# Patient Record
Sex: Male | Born: 1970 | Race: White | Hispanic: No | Marital: Single | Smoking: Former smoker
Health system: Southern US, Community
[De-identification: ages and names within clinical notes are randomized; demographics above are authoritative.]

## PROBLEM LIST (undated history)

## (undated) DIAGNOSIS — T8859XA Other complications of anesthesia, initial encounter: Secondary | ICD-10-CM

## (undated) DIAGNOSIS — K219 Gastro-esophageal reflux disease without esophagitis: Secondary | ICD-10-CM

## (undated) DIAGNOSIS — T4145XA Adverse effect of unspecified anesthetic, initial encounter: Secondary | ICD-10-CM

## (undated) DIAGNOSIS — F419 Anxiety disorder, unspecified: Secondary | ICD-10-CM

## (undated) HISTORY — PX: SHOULDER SURGERY: SHX246

---

## 2001-12-01 ENCOUNTER — Ambulatory Visit (HOSPITAL_BASED_OUTPATIENT_CLINIC_OR_DEPARTMENT_OTHER): Admission: RE | Admit: 2001-12-01 | Discharge: 2001-12-01 | Payer: Self-pay | Admitting: Orthopedic Surgery

## 2002-08-10 ENCOUNTER — Encounter: Admission: RE | Admit: 2002-08-10 | Discharge: 2002-10-12 | Payer: Self-pay | Admitting: Internal Medicine

## 2003-05-31 ENCOUNTER — Emergency Department (HOSPITAL_COMMUNITY): Admission: EM | Admit: 2003-05-31 | Discharge: 2003-05-31 | Payer: Self-pay | Admitting: Family Medicine

## 2003-06-04 ENCOUNTER — Emergency Department (HOSPITAL_COMMUNITY): Admission: EM | Admit: 2003-06-04 | Discharge: 2003-06-04 | Payer: Self-pay | Admitting: Family Medicine

## 2004-02-10 HISTORY — PX: KNEE SURGERY: SHX244

## 2005-09-15 ENCOUNTER — Ambulatory Visit: Payer: Self-pay | Admitting: Internal Medicine

## 2005-09-20 ENCOUNTER — Emergency Department (HOSPITAL_COMMUNITY): Admission: EM | Admit: 2005-09-20 | Discharge: 2005-09-20 | Payer: Self-pay | Admitting: Emergency Medicine

## 2005-09-30 ENCOUNTER — Ambulatory Visit: Payer: Self-pay

## 2005-10-19 ENCOUNTER — Encounter: Admission: RE | Admit: 2005-10-19 | Discharge: 2005-10-19 | Payer: Self-pay | Admitting: Sports Medicine

## 2005-11-12 ENCOUNTER — Encounter: Admission: RE | Admit: 2005-11-12 | Discharge: 2005-11-12 | Payer: Self-pay | Admitting: Sports Medicine

## 2006-01-11 ENCOUNTER — Ambulatory Visit (HOSPITAL_COMMUNITY): Admission: RE | Admit: 2006-01-11 | Discharge: 2006-01-11 | Payer: Self-pay | Admitting: Orthopedic Surgery

## 2006-02-25 ENCOUNTER — Ambulatory Visit (HOSPITAL_COMMUNITY): Admission: RE | Admit: 2006-02-25 | Discharge: 2006-02-25 | Payer: Self-pay | Admitting: Orthopedic Surgery

## 2007-01-12 ENCOUNTER — Encounter: Payer: Self-pay | Admitting: Internal Medicine

## 2007-01-12 DIAGNOSIS — K219 Gastro-esophageal reflux disease without esophagitis: Secondary | ICD-10-CM

## 2007-01-12 DIAGNOSIS — F3289 Other specified depressive episodes: Secondary | ICD-10-CM | POA: Insufficient documentation

## 2007-01-12 DIAGNOSIS — F411 Generalized anxiety disorder: Secondary | ICD-10-CM | POA: Insufficient documentation

## 2007-01-12 DIAGNOSIS — M25519 Pain in unspecified shoulder: Secondary | ICD-10-CM | POA: Insufficient documentation

## 2007-01-12 DIAGNOSIS — F329 Major depressive disorder, single episode, unspecified: Secondary | ICD-10-CM

## 2007-08-14 ENCOUNTER — Emergency Department (HOSPITAL_COMMUNITY): Admission: EM | Admit: 2007-08-14 | Discharge: 2007-08-14 | Payer: Self-pay | Admitting: Family Medicine

## 2007-09-13 ENCOUNTER — Encounter: Admission: RE | Admit: 2007-09-13 | Discharge: 2007-09-13 | Payer: Self-pay | Admitting: Family Medicine

## 2007-09-16 ENCOUNTER — Encounter: Admission: RE | Admit: 2007-09-16 | Discharge: 2007-09-16 | Payer: Self-pay | Admitting: Family Medicine

## 2010-03-01 ENCOUNTER — Encounter: Payer: Self-pay | Admitting: Sports Medicine

## 2010-03-02 ENCOUNTER — Encounter: Payer: Self-pay | Admitting: Internal Medicine

## 2010-06-27 NOTE — Op Note (Signed)
NAME:  Scott Horton, Scott Horton                         ACCOUNT NO.:  [id number]   MEDICAL RECORD NO.:  [id number]                   PATIENT TYPE:  AMB   LOCATION:  DSC                                  FACILITY:  [facility]   PHYSICIAN:  Loreta Ave, M.D.              DATE OF BIRTH:  April 21, 1970   DATE OF PROCEDURE:  12/01/2001  DATE OF DISCHARGE:                                 OPERATIVE REPORT   PREOPERATIVE DIAGNOSES:  1. Impingement, right shoulder, with question tearing, infraspinatus tendon.  2. Grade 4 degenerative joint disease, acromioclavicular joint.   POSTOPERATIVE DIAGNOSES:  1. Right shoulder chronic impingement.  2. No full-thickness tears of the cuff.  Partial-thickness tearing of the     subscapularis tendon.  Labrum tear.  3. Grade 4 degenerative joint disease, acromioclavicular joint.  4. Thickened subacromial bursa.   PROCEDURES:  1. Right shoulder examination under anesthesia, arthroscopy, debridement of     labrum and partial tearing of subscapularis tendon.  2. Arthroscopic acromioplasty and excision, distal clavicle.   SURGEON:  Loreta Ave, M.D.   ASSISTANT:  Arlys John D. Petrarca, P.A.-C.   ANESTHESIA:  General.   ESTIMATED BLOOD LOSS:  Minimal.   SPECIMENS:  None.   CULTURES:  None.   COMPLICATIONS:  None.   DRESSING:  Soft compressive with sling.   DESCRIPTION OF PROCEDURE:  Patient brought to the operating room and after  adequate anesthesia had been obtained, the right shoulder examined.  Full  motion and good stability.  Placed in the beach chair position on the  shoulder positioner, prepped and draped in the usual sterile fashion.  Three  standard portals, anterior, posterior, lateral.  Shoulder entered with a  blunt obturator, distended, and inspected.  Complex tearing, labrum anterior  and posterior, debrided with a shaver.  Superficial partial-thickness  tearing of subscapularis tendon, debrided with a shaver, leaving good  integrity of  the tendon after debridement.  Biceps tendon, biceps anchor,  undersurface rotator cuff, infraspinatus and supraspinatus intact.  Cannula  redirected subacromially.  Typical impingement.  Bursa resected.  Type 2  acromion.  Converted to a type 1 acromion with shaver and high-speed bur,  releasing CA ligamnet.  Top of the cuff had some abrasive changes, which  were debrided.  The infraspinatus and supraspinatus thoroughly inspected and  no other structural tears.  Grade 4 tears of the [facility] joint with spurring.  Lateral centimeter of clavicle resected along with periarticular spurs.  After the procedure, decompression confirmed viewing from all portals.  The  instruments and fluid removed.  Portals, shoulder, and bursa injected with  Marcaine.  Portals closed with 4-0 nylon.  Sterile compressive dressing  applied.  Anesthesia reversed.  Brought to the recovery room.  Tolerated the  surgery well with no complications.  Loreta Ave, M.D.    DFM/MEDQ  D:  12/01/2001  T:  12/01/2001  Job:  [id number]

## 2010-06-27 NOTE — Assessment & Plan Note (Signed)
[facility]                             PRIMARY CARE OFFICE NOTE   NAME:Scott Horton, Scott Horton                      MRN:          [id number]  DATE:09/15/2005                            DOB:          09/21/1970    CHIEF COMPLAINT:  New patient to practice/chest pain.   HISTORY OF PRESENT ILLNESS:  The patient is a 40 year old white male who  comes to see me to establish primary care.  He was previously followed by  Dr. Kevan Ny in [place].  The patient has been fairly healthy for most of  his life.  He denies any major illnesses or hospitalizations.  He has a  history of arthroscopic surgery within the past 6 months to a year by  orthopedic group Eulah Pont and Thurston Hole.  States that he does have residual right  shoulder discomfort.  His only other history is some issues with  mood/anxiety.  He was placed on SSRI Paxil.  This was ultimately switched to  Lexapro.  This was also the time around his brother's death.  He had some  grief reaction and it has been approximately 1 year since he has been on  Lexapro.  He has done fairly well, does not report any side effects and his  co-workers and family have noted a significant improvement of mood and  disposition while he is on Lexapro.  Denies any other history mood disorder.  He does have chronic insomnia.  He denies chronic irritability.  Lexapro or  other SSRI have not caused any mania type symptoms.  He is divorced but has  had no issues with keeping a job or holding a job.   He mentions during our interview that he has experienced intermittent chest  pain over the last several months.  He states it is nonexertional.  The last  time he experienced chest pain it was at work.  He was not lifting anything  heavy but did notice a tightness on the left side of his chest that lasted  approximately 2 hours and resolved on its own.  There was no associated  shortness of breath or diaphoresis and the pain has not recurred  since that  time.   He does, however, have a significant family history.  His father died at age  67 secondary to coronary artery disease/MI.  His father is noted to be a  long time smoker and was also an alcoholic and a mild diabetic.   The patient was a smoker up until recently.  He quit 1 month ago but smoked  1 pack per day times 15-16 years.   PAST MEDICAL HISTORY SUMMARY:  1.  Anxiety/depression.  2.  History of right shoulder arthroscopy.  3.  History of tobacco abuse.   CURRENT MEDICATIONS:  Lexapro 10 mg once a day.   ALLERGIES TO MEDICATIONS:  NONE KNOWN.   SOCIAL HISTORY:  The patient is divorced.  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   FAMILY HISTORY:  As noted above.  Father deceased at age 49 secondary to  coronary artery disease.  Mother alive at age 73, has hypertension, treated.  He has 2 older sister that are healthy.  He had a younger brother deceased  secondary to motor vehicle accident.   HABITS:  He drinks 2-3 beers per week.  Tobacco:  History as noted above.   REVIEW OF SYSTEMS:  No fevers, chills.  No HEENT symptoms.  CARDIOVASCULAR:  Symptoms as noted above.  He denies any exercise intolerance.  He has  frequent heartburn with nocturnal symptoms.  Denies any dysphagia.  No dark  stools or blood in his stool.  No dysuria, frequency, urgency and all other  systems negative.   PHYSICAL EXAMINATION:  VITAL SIGNS:  Height is 5 foot 9 inches, weight is  200 pounds, temperature is 97.6, pulse is 80, BP is 138/82 in the left arm  in a seated position with automated cuff, 130/80 with manual cuff.  GENERAL:  The patient is a pleasant, well-developed, well-nourished, 35-year-  old, white male in no apparent distress.  HEENT:  Normocephalic atraumatic.  Pupils are equal and reactive to light  bilaterally.  Extraocular motility was intact.  The patient was anicteric.   Conjunctivae was within normal limits.  There were some small scars near the  bridge of his nose, near the right corner of his eye.  External auditory  canals, tympanic membranes were clear bilaterally.  Hearing was grossly  normal.  Oropharyngeal exam was unremarkable.  NECK:  Supple.  No adenopathy, carotid bruits, or thyromegaly.  CHEST:  Normal respiratory effort.  Clear to auscultation bilaterally.  No  rhonchi, rales, or wheezing.  CARDIOVASCULAR:  Regular rate and rhythm.  No significant murmurs, rubs, or  gallops appreciated.  There was no palpable tenderness near his  costochondral junction.  ABDOMEN:  Soft, minimal epigastric tenderness.  No organomegaly appreciated.  EXTREMITIES:  No clubbing, cyanosis, or edema.  SKIN:  Warm and dry.  The patient had intact pedis dorsalis pulses equal and  symmetric.  NEUROLOGIC:  Cranial nerves II-XII were grossly intact.  He was nonfocal.  His mood and affect were appropriate.   EKG was performed in the office which showed normal sinus rhythm at 75 beats  per minute.  He had a rightward axis and nonspecific ST changes, inverted T  waves in inferior leads III and aVF.   IMPRESSION:  1.  Atypical chest pain in a 40 year old white male with a past medical      history of tobacco abuse and strong family history.  2.  Gastroesophageal reflux disease.  3.  History of generalized anxiety disorder.  4.  Health maintenance.   RECOMMENDATIONS:  The patient will be sent for treadmill Myoview.  Although  his symptoms are not typical, I think he has enough risk factors to warrant  further testing.  He was asked to start baby aspirin for his  gastroesophageal reflux disease.  He will be started on Prilosec 20 mg  b.i.d.  He is to continue his Lexapro for now, and we will further risk  stratify him by checking his cholesterol and fasting blood sugars.  Followup  time will be in approximately 1 month.                                  Barbette Hair.  Artist Pais, DO   RDY/MedQ  DD:  09/15/2005  DT:  09/15/2005  Job #:  [id number] 

## 2010-06-27 NOTE — Op Note (Signed)
NAMEBRAYSON, LIVESEY NO.:  [id number]   MEDICAL RECORD NO.:  [id number]          PATIENT TYPE:  AMB   LOCATION:  DAY                          FACILITY:  [facility]   PHYSICIAN:  Ollen Gross, M.D.    DATE OF BIRTH:  [birth year]   DATE OF PROCEDURE:  02/25/2006  DATE OF DISCHARGE:                               OPERATIVE REPORT   PREOP DIAGNOSIS:  Left knee medial meniscal tear.   POSTOPERATIVE DIAGNOSIS:  Left knee medial meniscal tear.   PROCEDURE:  Left knee arthroscopy meniscal debridement.   SURGEON:  Ollen Gross, M.D.   ASSISTANT:  None.   ANESTHESIA:  General.   ESTIMATED BLOOD LOSS:  Minimal.   DRAIN:  None.   COMPLICATION:  None.   CONDITION:  Stable to the recovery.   BRIEF CLINICAL NOTE:  Davaun is a 40 year old male who had a greater  than 26-month history of significant pain and mechanical symptoms on his  left knee.  Exam and history suggested a medial meniscal tear with  possible bucket handle component.  This was confirmed by MRI.  He  presents now for arthroscopy and debridement.   PROCEDURE IN DETAIL:  After the successful administration of general  anesthetic, a tourniquet was placed high on the left thigh, and the left  lower extremity prepped and draped in the usual sterile fashion.  A  standard superomedial and inferolateral incision was made.  In flow  cannula passed superomedial and camera passed inferolateral.  Arthroscopic visualization proceeds.  There is a significant amount of  synovitis in the joint.  Undersurface of the patella and trochlea looked  normal.  Flexion and valgus force is applied to the knee; and the medial  compartment is entered.  The medial gutter had no loose bodies.  The  medial compartment is entered; and there is evidence of a bucket-handle  tear posterior horn, and body of the medial meniscus which is flipped  into the joint.   A spinal needle is used to localize the inferomedial portal.  Small  incision made, dilator placed, and then that meniscal fragment is  debrided and removed with baskets and a 4.2 mm shaver.  There is still a  tear in the remnant posteriorly, which I debrided with the basket and  shaver.  We then sealed off the meniscal remnant with the ArthroCare to  help stabilize the edges.  It was probed and found to be stable.  There  was no chondral defect on the medial femoral condyle or tibial plateau.  Medial compartment was again inspected; and no other loose bodies or  tears noted.  Intercondylar notch is visualized, ACL appears normal.  Lateral compartment is entered and it is normal.  The hypertrophic  synovium in the infrapatellar area is debrided; and then joint, again,  inspected; and no other tears or loose bodies are identified.   The arthroscopic equipment is then removed from the inferior portals  which are closed with interrupted 4-0 nylon.  Then 20 mL of 1/4%  Marcaine with epi injected through the inflow cannula; and then that is  removed;  and that portal closed with nylon.  Bulky sterile dressing is  applied; and he is awakened and transferred to recovery in stable  condition.      Ollen Gross, M.D.  Electronically Signed     FA/MEDQ  D:  02/25/2006  T:  02/25/2006  Job:  [id number]

## 2011-07-11 HISTORY — PX: BACK SURGERY: SHX140

## 2012-06-17 DIAGNOSIS — M51369 Other intervertebral disc degeneration, lumbar region without mention of lumbar back pain or lower extremity pain: Secondary | ICD-10-CM | POA: Insufficient documentation

## 2012-06-17 DIAGNOSIS — M48061 Spinal stenosis, lumbar region without neurogenic claudication: Secondary | ICD-10-CM | POA: Insufficient documentation

## 2012-06-17 DIAGNOSIS — M5136 Other intervertebral disc degeneration, lumbar region: Secondary | ICD-10-CM | POA: Insufficient documentation

## 2012-07-07 DIAGNOSIS — Z981 Arthrodesis status: Secondary | ICD-10-CM | POA: Insufficient documentation

## 2012-07-07 DIAGNOSIS — Z4789 Encounter for other orthopedic aftercare: Secondary | ICD-10-CM | POA: Insufficient documentation

## 2013-07-10 HISTORY — PX: LUMBAR FUSION: SHX111

## 2013-12-12 ENCOUNTER — Other Ambulatory Visit: Payer: Self-pay | Admitting: Orthopedic Surgery

## 2013-12-12 DIAGNOSIS — M533 Sacrococcygeal disorders, not elsewhere classified: Principal | ICD-10-CM

## 2013-12-12 DIAGNOSIS — G8929 Other chronic pain: Secondary | ICD-10-CM

## 2013-12-18 ENCOUNTER — Other Ambulatory Visit: Payer: Self-pay | Admitting: Orthopedic Surgery

## 2013-12-18 DIAGNOSIS — M533 Sacrococcygeal disorders, not elsewhere classified: Principal | ICD-10-CM

## 2013-12-18 DIAGNOSIS — G8929 Other chronic pain: Secondary | ICD-10-CM

## 2013-12-19 ENCOUNTER — Ambulatory Visit
Admission: RE | Admit: 2013-12-19 | Discharge: 2013-12-19 | Disposition: A | Discharge status: Home / Self Care | Payer: Worker's Compensation | Source: Ambulatory Visit | Attending: Orthopedic Surgery | Admitting: Orthopedic Surgery

## 2013-12-19 DIAGNOSIS — M533 Sacrococcygeal disorders, not elsewhere classified: Principal | ICD-10-CM

## 2013-12-19 DIAGNOSIS — G8929 Other chronic pain: Secondary | ICD-10-CM

## 2013-12-19 NOTE — Discharge Instructions (Signed)
Sacroiliac Joint Dysfunction °The sacroiliac joint connects the lower part of the spine (the sacrum) with the bones of the pelvis. °CAUSES  °Sometimes, there is no obvious reason for sacroiliac joint dysfunction. Other times, it may occur  °· During pregnancy. °· After injury, such as: °¨ Car accidents. °¨ Sport-related injuries. °¨ Work-related injuries. °· Due to one leg being shorter than the other. °· Due to other conditions that affect the joints, such as: °¨ Rheumatoid arthritis. °¨ Gout. °¨ Psoriasis. °¨ Joint infection (septic arthritis). °SYMPTOMS  °Symptoms may include: °· Pain in the: °¨ Lower back. °¨ Buttocks. °¨ Groin. °¨ Thighs and legs. °· Difficult sitting, standing, walking, lying, bending or lifting. °DIAGNOSIS  °A number of tests may be used to help diagnose the cause of sacroiliac joint dysfunction, including: °· Imaging tests to look for other causes of pain, including: °¨ MRI. °¨ CT scan. °¨ Bone scan. °· Diagnostic injection: During a special x-ray (called fluoroscopy), a needle is put into the sacroiliac joint. A numbing medicine is injected into the joint. If the pain is improved or stopped, the diagnosis of sacroiliac joint dysfunction is more likely. °TREATMENT  °There are a number of types of treatment used for sacroiliac joint dysfunction, including: °· Only take over-the-counter or prescription medicines for pain, discomfort, or fever as directed by your caregiver. °· Medications to relax muscles. °· Rest. Decreasing activity can help cut down on painful muscle spasms and allow the back to heal. °· Application of heat or ice to the lower back may improve muscle spasms and soothe pain. °· Brace. A special back brace, called a sacroiliac belt, can help support the joint while your back is healing. °· Physical therapy can help teach comfortable positions and exercises to strengthen muscles that support the sacroiliac joint. °· Cortisone injections. Injections of steroid medicine into the  joint can help decrease swelling and improve pain. °· Hyaluronic acid injections. This chemical improves lubrication within the sacroiliac joint, thereby decreasing pain. °· Radiofrequency ablation. A special needle is placed into the joint, where it burns away nerves that are carrying pain messages from the joint. °· Surgery. Because pain occurs during movement of the joint, screws and plates may be installed in order to limit or prevent joint motion. °HOME CARE INSTRUCTIONS  °· Take all medications exactly as directed. °· Follow instructions regarding both rest and physical activity, to avoid worsening the pain. °· Do physical therapy exercises exactly as prescribed. °SEEK IMMEDIATE MEDICAL CARE IF: °· You experience increasingly severe pain. °· You develop new symptoms, such as numbness or tingling in your legs or feet. °· You lose bladder or bowel control. °Document Released: 04/24/2008 Document Revised: 04/20/2011 Document Reviewed: 04/24/2008 °ExitCare® Patient Information ©2015 [employer]. This information is not intended to replace advice given to you by your health care provider. Make sure you discuss any questions you have with your health care provider. ° °

## 2014-01-01 ENCOUNTER — Ambulatory Visit
Admission: RE | Admit: 2014-01-01 | Discharge: 2014-01-01 | Disposition: A | Discharge status: Home / Self Care | Payer: Worker's Compensation | Source: Ambulatory Visit | Attending: Orthopedic Surgery | Admitting: Orthopedic Surgery

## 2014-01-01 DIAGNOSIS — G8929 Other chronic pain: Secondary | ICD-10-CM

## 2014-01-01 DIAGNOSIS — M533 Sacrococcygeal disorders, not elsewhere classified: Principal | ICD-10-CM

## 2014-02-12 ENCOUNTER — Other Ambulatory Visit: Payer: Self-pay | Admitting: Orthopedic Surgery

## 2014-02-14 ENCOUNTER — Encounter (HOSPITAL_COMMUNITY): Payer: Self-pay

## 2014-02-14 ENCOUNTER — Encounter (HOSPITAL_COMMUNITY)
Admission: RE | Admit: 2014-02-14 | Discharge: 2014-02-14 | Disposition: A | Discharge status: Home / Self Care | Payer: Worker's Compensation | Source: Ambulatory Visit | Attending: Orthopedic Surgery | Admitting: Orthopedic Surgery

## 2014-02-14 ENCOUNTER — Ambulatory Visit (HOSPITAL_COMMUNITY)
Admission: RE | Admit: 2014-02-14 | Discharge: 2014-02-14 | Disposition: A | Discharge status: Home / Self Care | Payer: Worker's Compensation | Source: Ambulatory Visit | Attending: Orthopedic Surgery | Admitting: Orthopedic Surgery

## 2014-02-14 DIAGNOSIS — K219 Gastro-esophageal reflux disease without esophagitis: Secondary | ICD-10-CM | POA: Diagnosis not present

## 2014-02-14 DIAGNOSIS — M533 Sacrococcygeal disorders, not elsewhere classified: Secondary | ICD-10-CM | POA: Diagnosis not present

## 2014-02-14 DIAGNOSIS — Z01818 Encounter for other preprocedural examination: Secondary | ICD-10-CM | POA: Diagnosis not present

## 2014-02-14 DIAGNOSIS — F418 Other specified anxiety disorders: Secondary | ICD-10-CM | POA: Diagnosis not present

## 2014-02-14 DIAGNOSIS — Z87891 Personal history of nicotine dependence: Secondary | ICD-10-CM | POA: Diagnosis not present

## 2014-02-14 HISTORY — DX: Gastro-esophageal reflux disease without esophagitis: K21.9

## 2014-02-14 HISTORY — DX: Anxiety disorder, unspecified: F41.9

## 2014-02-14 HISTORY — DX: Adverse effect of unspecified anesthetic, initial encounter: T41.45XA

## 2014-02-14 HISTORY — DX: Other complications of anesthesia, initial encounter: T88.59XA

## 2014-02-14 LAB — CBC WITH DIFFERENTIAL/PLATELET
BASOS PCT: 0 % (ref 0–1)
Basophils Absolute: 0 10*3/uL (ref 0.0–0.1)
Eosinophils Absolute: 0.2 10*3/uL (ref 0.0–0.7)
Eosinophils Relative: 3 % (ref 0–5)
HCT: 42.7 % (ref 39.0–52.0)
Hemoglobin: 14.7 g/dL (ref 13.0–17.0)
LYMPHS PCT: 31 % (ref 12–46)
Lymphs Abs: 2.1 10*3/uL (ref 0.7–4.0)
MCH: 29.9 pg (ref 26.0–34.0)
MCHC: 34.4 g/dL (ref 30.0–36.0)
MCV: 86.8 fL (ref 78.0–100.0)
MONOS PCT: 8 % (ref 3–12)
Monocytes Absolute: 0.5 10*3/uL (ref 0.1–1.0)
Neutro Abs: 3.8 10*3/uL (ref 1.7–7.7)
Neutrophils Relative %: 58 % (ref 43–77)
PLATELETS: 292 10*3/uL (ref 150–400)
RBC: 4.92 MIL/uL (ref 4.22–5.81)
RDW: 12.3 % (ref 11.5–15.5)
WBC: 6.6 10*3/uL (ref 4.0–10.5)

## 2014-02-14 LAB — PROTIME-INR
INR: 0.98 (ref 0.00–1.49)
Prothrombin Time: 13.1 seconds (ref 11.6–15.2)

## 2014-02-14 LAB — URINALYSIS, ROUTINE W REFLEX MICROSCOPIC
Bilirubin Urine: NEGATIVE
GLUCOSE, UA: NEGATIVE mg/dL
Hgb urine dipstick: NEGATIVE
Ketones, ur: NEGATIVE mg/dL
Leukocytes, UA: NEGATIVE
Nitrite: NEGATIVE
Protein, ur: NEGATIVE mg/dL
SPECIFIC GRAVITY, URINE: 1.029 (ref 1.005–1.030)
UROBILINOGEN UA: 0.2 mg/dL (ref 0.0–1.0)
pH: 5 (ref 5.0–8.0)

## 2014-02-14 LAB — COMPREHENSIVE METABOLIC PANEL
ALBUMIN: 4 g/dL (ref 3.5–5.2)
ALT: 33 U/L (ref 0–53)
AST: 25 U/L (ref 0–37)
Alkaline Phosphatase: 53 U/L (ref 39–117)
Anion gap: 8 (ref 5–15)
BUN: 17 mg/dL (ref 6–23)
CALCIUM: 9.1 mg/dL (ref 8.4–10.5)
CHLORIDE: 105 meq/L (ref 96–112)
CO2: 25 mmol/L (ref 19–32)
CREATININE: 1.17 mg/dL (ref 0.50–1.35)
GFR calc Af Amer: 86 mL/min — ABNORMAL LOW (ref >90)
GFR calc non Af Amer: 74 mL/min — ABNORMAL LOW (ref >90)
GLUCOSE: 139 mg/dL — AB (ref 70–99)
POTASSIUM: 3.8 mmol/L (ref 3.5–5.1)
Sodium: 138 mmol/L (ref 135–145)
Total Bilirubin: 0.6 mg/dL (ref 0.3–1.2)
Total Protein: 6.8 g/dL (ref 6.0–8.3)

## 2014-02-14 LAB — SURGICAL PCR SCREEN
MRSA, PCR: NEGATIVE
Staphylococcus aureus: POSITIVE — AB

## 2014-02-14 LAB — APTT: APTT: 34 s (ref 24–37)

## 2014-02-14 MED ORDER — CEFAZOLIN SODIUM-DEXTROSE 2-3 GM-% IV SOLR
2.0000 g | INTRAVENOUS | Status: AC
  Administered 2014-02-15: 2 g via INTRAVENOUS
  Filled 2014-02-14: qty 50

## 2014-02-14 NOTE — H&P (Signed)
     PREOPERATIVE H&P  Chief Complaint: Left low back pain  HPI: Scott Horton is a 44 y.o. male who presents with ongoing pain in the left low back  Patient's exam is c/w left SI dysfunction. Patient did get an appropriate, but temporary, response to a left SI injection.  Patient has failed multiple forms of conservative care and continues to have pain (see office notes for additional details regarding the patient's full course of treatment)  Past Medical History  Diagnosis Date  . Complication of anesthesia     his left eye was scratched by the tape during surgery  . Anxiety     history of  in the past, doesn't take anything now  . GERD (gastroesophageal reflux disease)    Past Surgical History  Procedure Laterality Date  . Back surgery  07/2011  . Lumbar fusion  07/2013  . Knee surgery Left 2006    torn menicus  . Shoulder surgery Right 20 yrs. ago   History   Social History  . Marital Status: Single    Spouse Name: N/A    Number of Children: N/A  . Years of Education: N/A   Social History Main Topics  . Smoking status: Former Smoker -- 1.00 packs/day for 20 years    Quit date: 01/02/2010  . Smokeless tobacco: Current User    Types: Chew  . Alcohol Use: Yes     Comment: social  . Drug Use: No  . Sexual Activity: Not on file   Other Topics Concern  . Not on file   Social History Narrative  . No narrative on file   No family history on file. Allergies  Allergen Reactions  . Hydrocodone Itching   Prior to Admission medications   Medication Sig Start Date End Date Taking? Authorizing Provider  HYDROcodone-acetaminophen (NORCO/VICODIN) 5-325 MG per tablet Take 1 tablet by mouth 3 (three) times daily as needed (2 tablets at bedtime, and 1 daily prn).  02/08/14  Yes Historical Provider, MD  ibuprofen (ADVIL,MOTRIN) 200 MG tablet Take 800 mg by mouth every 6 (six) hours as needed for mild pain or moderate pain.   Yes Historical Provider, MD     All other  systems have been reviewed and were otherwise negative with the exception of those mentioned in the HPI and as above.  Physical Exam: There were no vitals filed for this visit.  General: Alert, no acute distress Cardiovascular: No pedal edema Respiratory: No cyanosis, no use of accessory musculature Skin: No lesions in the area of chief complaint Neurologic: Sensation intact distally Psychiatric: Patient is competent for consent with normal mood and affect Lymphatic: No axillary or cervical lymphadenopathy  MUSCULOSKELETAL: + TTP left low back  Assessment/Plan: Left sided sacroiliac joint dysfunction Plan for Procedure(s): SACROILIAC JOINT FUSION   Emilee HeroUMONSKI,Jerrick Farve LEONARD, MD 02/14/2014 6:32 PM

## 2014-02-14 NOTE — Pre-Procedure Instructions (Signed)
Scott RothmanCharles W Horton  02/14/2014   Your procedure is scheduled on:  Thursday, Jan. 7  [facility]" at 10:00 AM.  Call this number if you have problems the morning of surgery: [phone]   Remember:   Do not eat food or drink liquids after midnight.   Take these medicines the morning of surgery with A SIP OF WATER: hydrocodone   Do not wear jewelry, make-up or nail polish.  Do not wear lotions, powders, or perfumes. You may wear deodorant.  Do not shave 48 hours prior to surgery. Men may shave face and neck.  Do not bring valuables to the hospital.  [facility] is not responsible for any belongings or valuables.               Contacts, dentures or bridgework may not be worn into surgery.  Leave suitcase in the car. After surgery it may be brought to your room.  For patients admitted to the hospital, discharge time is determined by your   treatment team.               Patients discharged the day of surgery will not be allowed to drive  home.  Name and phone number of your driver:   Special Instructions: review preparing for surgery handout   Please read over the following fact sheets that you were given: Pain Booklet, Coughing and Deep Breathing, MRSA Information and Surgical Site Infection Prevention

## 2014-02-14 NOTE — Progress Notes (Signed)
Anesthesia Chart Review:  Patient is a 44 year old male scheduled for left sided SI joint fusion on 02/15/14 by Dr. Yevette Edwardsumonski.  History includes GERD, anxiety, lumbar fusion. BMI is consistent with mild obesity.  No PCP.  EKG 02/14/14: NSR, anterior infarct (age undetermined).  No CP or SOB reported at PAT. No known CAD, DM, CHF, or HTN history.  No comparison EKGs available.  CXR 02/14/14: There is no active cardiopulmonary disease.  Preoperative labs noted.   If he remains asymptomatic from a CV standpoint then I would anticipate that he could proceed as planned.  Velna Ochsllison Maysun Meditz, PA-C [facility]/Anesthesiology Phone [phone] 02/14/2014 4:24 PM

## 2014-02-14 NOTE — Progress Notes (Addendum)
Orders state for pt. To arrive at 10:00. Called Dr. Antonietta Barcelonadumonsli's office spoke to [place], stated for pt. To arrive at 8:30am. Pt. Informed.  Violet BaldyAllison Zelenack,PA informed of pt's ekg. Pt. Denies chest pain, or shortness of breath. PCP: pt. States none

## 2014-02-15 ENCOUNTER — Ambulatory Visit (HOSPITAL_COMMUNITY): Payer: Worker's Compensation

## 2014-02-15 ENCOUNTER — Ambulatory Visit (HOSPITAL_COMMUNITY)
Admission: RE | Admit: 2014-02-15 | Discharge: 2014-02-15 | Disposition: A | Discharge status: Home / Self Care | Payer: Worker's Compensation | Source: Ambulatory Visit | Attending: Orthopedic Surgery | Admitting: Orthopedic Surgery

## 2014-02-15 ENCOUNTER — Ambulatory Visit (HOSPITAL_COMMUNITY): Payer: Worker's Compensation | Admitting: Certified Registered Nurse Anesthetist

## 2014-02-15 ENCOUNTER — Encounter (HOSPITAL_COMMUNITY)
Admission: RE | Disposition: A | Discharge status: Home / Self Care | Payer: Self-pay | Source: Ambulatory Visit | Attending: Orthopedic Surgery

## 2014-02-15 ENCOUNTER — Encounter (HOSPITAL_COMMUNITY): Payer: Self-pay | Admitting: *Deleted

## 2014-02-15 ENCOUNTER — Ambulatory Visit (HOSPITAL_COMMUNITY): Payer: Worker's Compensation | Admitting: Vascular Surgery

## 2014-02-15 DIAGNOSIS — F418 Other specified anxiety disorders: Secondary | ICD-10-CM | POA: Insufficient documentation

## 2014-02-15 DIAGNOSIS — Z87891 Personal history of nicotine dependence: Secondary | ICD-10-CM | POA: Insufficient documentation

## 2014-02-15 DIAGNOSIS — M533 Sacrococcygeal disorders, not elsewhere classified: Secondary | ICD-10-CM | POA: Diagnosis not present

## 2014-02-15 DIAGNOSIS — K219 Gastro-esophageal reflux disease without esophagitis: Secondary | ICD-10-CM | POA: Insufficient documentation

## 2014-02-15 DIAGNOSIS — Z419 Encounter for procedure for purposes other than remedying health state, unspecified: Secondary | ICD-10-CM

## 2014-02-15 HISTORY — PX: SACROILIAC JOINT FUSION: SHX6088

## 2014-02-15 SURGERY — SACROILIAC JOINT FUSION
Anesthesia: General | Site: Back | Laterality: Left

## 2014-02-15 MED ORDER — PROPOFOL 10 MG/ML IV BOLUS
INTRAVENOUS | Status: AC
  Filled 2014-02-15: qty 20

## 2014-02-15 MED ORDER — MIDAZOLAM HCL 2 MG/2ML IJ SOLN
INTRAMUSCULAR | Status: AC
  Filled 2014-02-15: qty 2

## 2014-02-15 MED ORDER — NEOSTIGMINE METHYLSULFATE 10 MG/10ML IV SOLN
INTRAVENOUS | Status: DC | PRN
Start: 2014-02-15 — End: 2014-02-15
  Administered 2014-02-15: 4 mg via INTRAVENOUS

## 2014-02-15 MED ORDER — FENTANYL CITRATE 0.05 MG/ML IJ SOLN
INTRAMUSCULAR | Status: AC
  Filled 2014-02-15: qty 5

## 2014-02-15 MED ORDER — HYDROMORPHONE HCL 1 MG/ML IJ SOLN
0.2500 mg | INTRAMUSCULAR | Status: DC | PRN
Start: 2014-02-15 — End: 2014-02-15
  Administered 2014-02-15: 0.5 mg via INTRAVENOUS
  Administered 2014-02-15: 1 mg via INTRAVENOUS
  Administered 2014-02-15: 0.5 mg via INTRAVENOUS

## 2014-02-15 MED ORDER — BUPIVACAINE-EPINEPHRINE (PF) 0.25% -1:200000 IJ SOLN
INTRAMUSCULAR | Status: DC | PRN
  Administered 2014-02-15: 15 mL

## 2014-02-15 MED ORDER — ONDANSETRON HCL 4 MG/2ML IJ SOLN
INTRAMUSCULAR | Status: AC
Start: 2014-02-15 — End: 2014-02-15
  Filled 2014-02-15: qty 2

## 2014-02-15 MED ORDER — LACTATED RINGERS IV SOLN
INTRAVENOUS | Status: DC
  Administered 2014-02-15: 09:00:00 via INTRAVENOUS

## 2014-02-15 MED ORDER — HYDROMORPHONE HCL 1 MG/ML IJ SOLN
INTRAMUSCULAR | Status: AC
  Filled 2014-02-15: qty 1

## 2014-02-15 MED ORDER — LIDOCAINE HCL (CARDIAC) 20 MG/ML IV SOLN
INTRAVENOUS | Status: DC | PRN
  Administered 2014-02-15: 50 mg via INTRAVENOUS

## 2014-02-15 MED ORDER — ONDANSETRON HCL 4 MG/2ML IJ SOLN: 4.0000 mg | Freq: Four times a day (QID) | INTRAMUSCULAR | Status: DC | PRN

## 2014-02-15 MED ORDER — ONDANSETRON HCL 4 MG/2ML IJ SOLN
INTRAMUSCULAR | Status: AC
  Administered 2014-02-15: 4 mg
  Filled 2014-02-15: qty 2

## 2014-02-15 MED ORDER — ONDANSETRON HCL 4 MG/2ML IJ SOLN
INTRAMUSCULAR | Status: DC | PRN
  Administered 2014-02-15: 4 mg via INTRAVENOUS

## 2014-02-15 MED ORDER — ROCURONIUM BROMIDE 100 MG/10ML IV SOLN
INTRAVENOUS | Status: DC | PRN
  Administered 2014-02-15: 560 mg via INTRAVENOUS

## 2014-02-15 MED ORDER — FENTANYL CITRATE 0.05 MG/ML IJ SOLN
INTRAMUSCULAR | Status: DC | PRN
  Administered 2014-02-15: 150 ug via INTRAVENOUS

## 2014-02-15 MED ORDER — DEXAMETHASONE SODIUM PHOSPHATE 10 MG/ML IJ SOLN
INTRAMUSCULAR | Status: DC | PRN
  Administered 2014-02-15: 4 mg via INTRAVENOUS

## 2014-02-15 MED ORDER — FENTANYL CITRATE 0.05 MG/ML IJ SOLN
INTRAMUSCULAR | Status: AC
  Administered 2014-02-15: 100 ug via INTRAVENOUS
  Filled 2014-02-15: qty 2

## 2014-02-15 MED ORDER — HYDROMORPHONE HCL 1 MG/ML IJ SOLN
INTRAMUSCULAR | Status: DC
Start: 2014-02-15 — End: 2014-02-15
  Filled 2014-02-15: qty 1

## 2014-02-15 MED ORDER — BUPIVACAINE-EPINEPHRINE (PF) 0.25% -1:200000 IJ SOLN
INTRAMUSCULAR | Status: AC
  Filled 2014-02-15: qty 30

## 2014-02-15 MED ORDER — GLYCOPYRROLATE 0.2 MG/ML IJ SOLN
INTRAMUSCULAR | Status: DC | PRN
  Administered 2014-02-15: .8 mg via INTRAVENOUS

## 2014-02-15 MED ORDER — POVIDONE-IODINE 7.5 % EX SOLN
Freq: Once | CUTANEOUS | Status: DC
  Filled 2014-02-15: qty 118

## 2014-02-15 MED ORDER — FENTANYL CITRATE 0.05 MG/ML IJ SOLN
100.0000 ug | Freq: Once | INTRAMUSCULAR | Status: AC
  Administered 2014-02-15: 100 ug via INTRAVENOUS

## 2014-02-15 MED ORDER — ARTIFICIAL TEARS OP OINT
TOPICAL_OINTMENT | OPHTHALMIC | Status: DC | PRN
  Administered 2014-02-15: 1 via OPHTHALMIC

## 2014-02-15 MED ORDER — LACTATED RINGERS IV SOLN
INTRAVENOUS | Status: DC | PRN
  Administered 2014-02-15 (×2): via INTRAVENOUS

## 2014-02-15 MED ORDER — MIDAZOLAM HCL 5 MG/5ML IJ SOLN
INTRAMUSCULAR | Status: DC | PRN
  Administered 2014-02-15: 2 mg via INTRAVENOUS

## 2014-02-15 MED ORDER — FENTANYL CITRATE 0.05 MG/ML IJ SOLN
INTRAMUSCULAR | Status: AC
Start: 2014-02-15 — End: 2014-02-15
  Filled 2014-02-15: qty 5

## 2014-02-15 MED ORDER — PROPOFOL 10 MG/ML IV BOLUS
INTRAVENOUS | Status: DC | PRN
  Administered 2014-02-15: 200 mg via INTRAVENOUS

## 2014-02-15 SURGICAL SUPPLY — 60 items
APL SKNCLS STERI-STRIP NONHPOA (GAUZE/BANDAGES/DRESSINGS) ×1
BENZOIN TINCTURE PRP APPL 2/3 (GAUZE/BANDAGES/DRESSINGS) ×3 IMPLANT
BLADE SURG 10 STRL SS (BLADE) ×3 IMPLANT
BLADE SURG 11 STRL SS (BLADE) ×3 IMPLANT
BLADE SURG ROTATE 9660 (MISCELLANEOUS) ×3 IMPLANT
CANISTER SUCTION 2500CC (MISCELLANEOUS) ×3 IMPLANT
CAP-I-FUSE IMPLANT SYSTEM ×2 IMPLANT
CLOSURE WOUND 1/2 X4 (GAUZE/BANDAGES/DRESSINGS) ×1
COVER SURGICAL LIGHT HANDLE (MISCELLANEOUS) ×4 IMPLANT
DRAPE C-ARM 42X72 X-RAY (DRAPES) ×3 IMPLANT
DRAPE C-ARMOR (DRAPES) ×3 IMPLANT
DRAPE INCISE IOBAN 66X45 STRL (DRAPES) ×3 IMPLANT
DRAPE POUCH INSTRU U-SHP 10X18 (DRAPES) ×3 IMPLANT
DRAPE SURG 17X23 STRL (DRAPES) ×9 IMPLANT
DURAPREP 26ML APPLICATOR (WOUND CARE) ×3 IMPLANT
ELECT CAUTERY BLADE 6.4 (BLADE) ×3 IMPLANT
ELECT REM PT RETURN 9FT ADLT (ELECTROSURGICAL) ×3
ELECTRODE REM PT RTRN 9FT ADLT (ELECTROSURGICAL) ×1 IMPLANT
GAUZE SPONGE 4X4 12PLY STRL (GAUZE/BANDAGES/DRESSINGS) ×3 IMPLANT
GAUZE SPONGE 4X4 16PLY XRAY LF (GAUZE/BANDAGES/DRESSINGS) ×3 IMPLANT
GLOVE BIO SURGEON STRL SZ7 (GLOVE) ×5 IMPLANT
GLOVE BIO SURGEON STRL SZ8 (GLOVE) ×3 IMPLANT
GLOVE BIOGEL PI IND STRL 7.0 (GLOVE) ×1 IMPLANT
GLOVE BIOGEL PI IND STRL 8 (GLOVE) ×1 IMPLANT
GLOVE BIOGEL PI INDICATOR 7.0 (GLOVE) ×6
GLOVE BIOGEL PI INDICATOR 8 (GLOVE) ×2
GLOVE BIOGEL PI ORTHO PRO SZ7 (GLOVE) ×2
GLOVE PI ORTHO PRO STRL SZ7 (GLOVE) IMPLANT
GLOVE SURG SS PI 6.5 STRL IVOR (GLOVE) ×2 IMPLANT
GOWN STRL REUS W/ TWL LRG LVL3 (GOWN DISPOSABLE) ×2 IMPLANT
GOWN STRL REUS W/ TWL XL LVL3 (GOWN DISPOSABLE) ×1 IMPLANT
GOWN STRL REUS W/TWL LRG LVL3 (GOWN DISPOSABLE) ×9
GOWN STRL REUS W/TWL XL LVL3 (GOWN DISPOSABLE) ×3
KIT BASIN OR (CUSTOM PROCEDURE TRAY) ×3 IMPLANT
KIT ROOM TURNOVER OR (KITS) ×3 IMPLANT
MANIFOLD NEPTUNE II (INSTRUMENTS) ×1 IMPLANT
NDL HYPO 25GX1X1/2 BEV (NEEDLE) ×1 IMPLANT
NEEDLE 22X1 1/2 (OR ONLY) (NEEDLE) ×3 IMPLANT
NEEDLE HYPO 25GX1X1/2 BEV (NEEDLE) ×3 IMPLANT
NS IRRIG 1000ML POUR BTL (IV SOLUTION) ×3 IMPLANT
PACK UNIVERSAL I (CUSTOM PROCEDURE TRAY) ×3 IMPLANT
PAD ARMBOARD 7.5X6 YLW CONV (MISCELLANEOUS) ×6 IMPLANT
PENCIL BUTTON HOLSTER BLD 10FT (ELECTRODE) ×3 IMPLANT
SPONGE GAUZE 4X4 12PLY STER LF (GAUZE/BANDAGES/DRESSINGS) ×2 IMPLANT
SPONGE LAP 18X18 X RAY DECT (DISPOSABLE) ×1 IMPLANT
STAPLER VISISTAT 35W (STAPLE) ×3 IMPLANT
STRIP CLOSURE SKIN 1/2X4 (GAUZE/BANDAGES/DRESSINGS) ×2 IMPLANT
SUT MNCRL AB 4-0 PS2 18 (SUTURE) ×3 IMPLANT
SUT VIC AB 0 CT1 18XCR BRD 8 (SUTURE) ×1 IMPLANT
SUT VIC AB 0 CT1 8-18 (SUTURE) ×3
SUT VIC AB 2-0 CT2 18 VCP726D (SUTURE) ×3 IMPLANT
SYR BULB IRRIGATION 50ML (SYRINGE) ×3 IMPLANT
SYR CONTROL 10ML LL (SYRINGE) ×3 IMPLANT
TAPE CLOTH SURG 4X10 WHT LF (GAUZE/BANDAGES/DRESSINGS) ×2 IMPLANT
TOWEL OR 17X24 6PK STRL BLUE (TOWEL DISPOSABLE) ×3 IMPLANT
TOWEL OR 17X26 10 PK STRL BLUE (TOWEL DISPOSABLE) ×6 IMPLANT
TUBE CONNECTING 12'X1/4 (SUCTIONS) ×1
TUBE CONNECTING 12X1/4 (SUCTIONS) ×2 IMPLANT
WATER STERILE IRR 1000ML POUR (IV SOLUTION) ×3 IMPLANT
YANKAUER SUCT BULB TIP NO VENT (SUCTIONS) ×3 IMPLANT

## 2014-02-15 NOTE — Discharge Instructions (Signed)

## 2014-02-15 NOTE — Transfer of Care (Signed)
Immediate Anesthesia Transfer of Care Note  Patient: Scott Horton  Procedure(s) Performed: Procedure(s) with comments: SACROILIAC JOINT FUSION (Left) - Left sided sacroiliac joint fusion  Patient Location: PACU  Anesthesia Type:General  Level of Consciousness: awake, alert  and oriented  Airway & Oxygen Therapy: Patient Spontanous Breathing and Patient connected to nasal cannula oxygen  Post-op Assessment: Report given to PACU RN, Post -op Vital signs reviewed and stable and Patient moving all extremities X 4  Post vital signs: Reviewed and stable  Complications: No apparent anesthesia complications

## 2014-02-15 NOTE — Anesthesia Postprocedure Evaluation (Signed)
  Anesthesia Post-op Note  Patient: Scott Horton  Procedure(s) Performed: Procedure(s) with comments: SACROILIAC JOINT FUSION (Left) - Left sided sacroiliac joint fusion  Patient Location: PACU  Anesthesia Type:General  Level of Consciousness: awake, alert  and oriented  Airway and Oxygen Therapy: Patient Spontanous Breathing and Patient connected to nasal cannula oxygen  Post-op Pain: mild  Post-op Assessment: Post-op Vital signs reviewed, Patient's Cardiovascular Status Stable, Respiratory Function Stable, Patent Airway and No signs of Nausea or vomiting  Post-op Vital Signs: stable  Last Vitals:  Filed Vitals:   02/15/14 1545  BP:   Pulse: 87  Temp: 36.3 C  Resp: 20    Complications: No apparent anesthesia complications

## 2014-02-15 NOTE — Progress Notes (Signed)
Orthopedic Tech Progress Note Patient Details:  Scott RothmanCharles W Horton 1970-11-04 [id number]  Ortho Devices Type of Ortho Device: Crutches Ortho Device/Splint Interventions: Ordered, Adjustment   Jennye MoccasinHughes, Omaya Nieland Craig 02/15/2014, 3:25 PM

## 2014-02-15 NOTE — Anesthesia Preprocedure Evaluation (Addendum)
Anesthesia Evaluation  Patient identified by MRN, date of birth, ID band Patient awake    Reviewed: Allergy & Precautions, NPO status , Patient's Chart, lab work & pertinent test results  Airway Mallampati: II  TM Distance: >3 FB Neck ROM: full    Dental  (+) Teeth Intact, Dental Advidsory Given   Pulmonary former smoker,          Cardiovascular negative cardio ROS      Neuro/Psych Anxiety Depression    GI/Hepatic GERD-  ,  Endo/Other  obese  Renal/GU      Musculoskeletal   Abdominal   Peds  Hematology   Anesthesia Other Findings   Reproductive/Obstetrics                            Anesthesia Physical Anesthesia Plan  ASA: II  Anesthesia Plan: General   Post-op Pain Management:    Induction: Intravenous  Airway Management Planned: Oral ETT  Additional Equipment:   Intra-op Plan:   Post-operative Plan: Extubation in OR  Informed Consent: I have reviewed the patients History and Physical, chart, labs and discussed the procedure including the risks, benefits and alternatives for the proposed anesthesia with the patient or authorized representative who has indicated his/her understanding and acceptance.   Dental Advisory Given  Plan Discussed with: CRNA, Anesthesiologist and Surgeon  Anesthesia Plan Comments:        Anesthesia Quick Evaluation

## 2014-02-15 NOTE — Anesthesia Procedure Notes (Signed)
Procedure Name: Intubation Date/Time: 02/15/2014 12:42 PM Performed by: Carmela RimaMARTINELLI, Zelena Bushong F Pre-anesthesia Checklist: Patient being monitored, Suction available, Emergency Drugs available, Patient identified and Timeout performed Patient Re-evaluated:Patient Re-evaluated prior to inductionOxygen Delivery Method: Circle system utilized Preoxygenation: Pre-oxygenation with 100% oxygen Intubation Type: IV induction Ventilation: Mask ventilation without difficulty Laryngoscope Size: Mac and 3 Grade View: Grade I Tube type: Oral Tube size: 7.5 mm Number of attempts: 1 Placement Confirmation: positive ETCO2,  ETT inserted through vocal cords under direct vision and breath sounds checked- equal and bilateral Secured at: 22 cm Tube secured with: Tape Dental Injury: Teeth and Oropharynx as per pre-operative assessment

## 2014-02-16 ENCOUNTER — Encounter (HOSPITAL_COMMUNITY): Payer: Self-pay | Admitting: Orthopedic Surgery

## 2014-02-16 NOTE — Op Note (Signed)
Scott Horton:  Scott Horton, Scott Horton               ACCOUNT NO.:  [id number]  MEDICAL RECORD NO.:  [id number]  LOCATION:  [facility]                         FACILITY:  [facility]  PHYSICIAN:  Scott BambergMark Sharol Croghan, MD      DATE OF BIRTH:  [birth year]  DATE OF PROCEDURE:  02/15/2014 DATE OF DISCHARGE:  02/15/2014                              OPERATIVE REPORT   PREOPERATIVE DIAGNOSIS:  Left-sided sacroiliac joint dysfunction.  POSTOPERATIVE DIAGNOSIS:  Left-sided sacroiliac joint dysfunction.  PROCEDURE:  Left-sided sacroiliac joint fusion using iFuse sacroiliac joint fusion systems.  SURGEON:  Scott BambergMark Saaya Procell, MD.  ASSISTANJason Horton:  Scott Horton, PAC.  ANESTHESIA:  General endotracheal anesthesia.  COMPLICATIONS:  None.  DISPOSITION:  Stable.  ESTIMATED BLOOD LOSS:  Minimal.  INDICATIONS FOR SURGERY:  Briefly, Mr. Scott HudsonYoung is a very pleasant 44 year old male who did initially present to me on December 11, 2013 with a chief complaint of left and right low back pain.  Per the patient, his pain began after a work injury that did occur on June 08, 2011.  He did go on to have substantial pain involving the right and left side of his low back.  He was subsequently diagnosed as having sacroiliac joint dysfunction.  He did get temporary relief with sacroiliac joint injections.  He also got some relief with medial branch blocks.  He did go forward with an RFA procedure which did not address his pain. Subsequently, we did proceed with an additional sacroiliac joint injection on the left.  He did not have an additional injection and did report 2 hours of relief.  His exam was very consistent with sacroiliac joint dysfunction.  Given the patient's ongoing pain and dysfunction and lack of failure of all forms of conservative care, we did discuss proceeding with the procedure noted above.  The patient did fully understand the risks and limitations of the procedure as outlined in my preoperative note.  DESCRIPTION OF PROCEDURE:   On February 15, 2014, the patient was brought to surgery and general endotracheal anesthesia was administered.  The patient was placed prone on a well-padded flat Jackson bed.  Gel rolls were placed under the patient's chest and hips.  Antibiotics were given and a time-out procedure was performed.  The region of the left buttock was prepped and draped appropriately.  A 3 cm incision was made in line with the posterior border of the sacrum, just beneath the sacral ala. Through this incision, a total of 3 guidewires were advanced across the sacroiliac joint.  One above the S1 foramen, one in line with it, and the third in line with the S2 foramen.  I then drilled and broached over the guidewires.  I then advanced 7-mm implants over the guidewires from cephalad to caudad.  The lengths of the implants from top to bottom were 65 mm, 50 mm, and 40 mm.  I was very pleased with the press fit of each of the implants.  Of note, I did liberally use as outlined as well as lateral fluoroscopy.  I was extremely pleased with the appearance of the implants.  The guidewires were then removed.  The wound was then copiously irrigated.  The wound was then  closed using 0 Vicryl, followed by 2-0 Vicryl, followed by 3-0 Monocryl.  Benzoin and Steri-Strips were applied followed by sterile dressing.  All instrument counts were correct at the termination of the procedure.  Of note, Scott Horton was my assistant for surgery and did aid in set up and exposure and retraction.     Scott Bamberg, MD     MD/MEDQ  D:  02/15/2014  T:  02/16/2014  Job:  [id number]

## 2014-09-04 ENCOUNTER — Encounter (HOSPITAL_COMMUNITY): Payer: Self-pay | Admitting: Emergency Medicine

## 2014-09-04 ENCOUNTER — Emergency Department (HOSPITAL_COMMUNITY)
Admission: EM | Admit: 2014-09-04 | Discharge: 2014-09-04 | Disposition: A | Discharge status: Home / Self Care | Payer: PRIVATE HEALTH INSURANCE | Attending: Emergency Medicine | Admitting: Emergency Medicine

## 2014-09-04 DIAGNOSIS — L559 Sunburn, unspecified: Secondary | ICD-10-CM | POA: Diagnosis present

## 2014-09-04 DIAGNOSIS — L550 Sunburn of first degree: Secondary | ICD-10-CM | POA: Insufficient documentation

## 2014-09-04 DIAGNOSIS — Z8659 Personal history of other mental and behavioral disorders: Secondary | ICD-10-CM | POA: Insufficient documentation

## 2014-09-04 DIAGNOSIS — Z8719 Personal history of other diseases of the digestive system: Secondary | ICD-10-CM | POA: Diagnosis not present

## 2014-09-04 MED ORDER — HYDROXYZINE HCL 25 MG PO TABS
25.0000 mg | ORAL_TABLET | Freq: Once | ORAL | Status: AC
  Administered 2014-09-04: 25 mg via ORAL
  Filled 2014-09-04: qty 1

## 2014-09-04 MED ORDER — HYDROXYZINE HCL 25 MG PO TABS: 25.0000 mg | ORAL_TABLET | Freq: Four times a day (QID) | ORAL | Status: DC | PRN

## 2014-09-04 NOTE — Discharge Instructions (Signed)
Sunburn Sunburn is damage to the skin caused by overexposure to ultraviolet (UV) rays. People with light skin or a fair complexion may be more susceptible to sunburn. Repeated sun exposure causes early skin aging such as wrinkles and sun spots. It also increases the risk of skin cancer. CAUSES A sunburn is caused by getting too much UV radiation from the sun. SYMPTOMS  Red or pink skin.  Soreness and swelling.  Pain.  Blisters.  Peeling skin.  Headache, fever, and fatigue if sunburn covers a large area. TREATMENT  Your caregiver may tell you to take certain medicines to lessen inflammation.  Your caregiver may have you use hydrocortisone cream or spray to help with itching and inflammation.  Your caregiver may prescribe an antibiotic cream to use on blisters. HOME CARE INSTRUCTIONS   Avoid further exposure to the sun.  Cool baths and cool compresses may be helpful if used several times per day. Do not apply ice, since this may result in more damage to the skin.  Only take over-the-counter or prescription medicines for pain, discomfort, or fever as directed by your caregiver.  Use aloe or other over-the-counter sunburn creams or gels on your skin. Do not apply these creams or gels on blisters.  Drink enough fluids to keep your urine clear or pale yellow.  Do not break blisters. If blisters break, your caregiver may recommend an antibiotic cream to apply to the affected area. PREVENTION   Try to avoid the sun between 10:00 a.m. and 4:00 p.m. when it is the strongest.  Apply sunscreen at least 30 minutes before exposure to the sun.  Always wear protective hats, clothing, and sunglasses with UV protection.  Avoid medicines, herbs, and foods that increase your sensitivity to sunlight.  Avoid tanning beds. SEEK IMMEDIATE MEDICAL CARE IF:   You have a fever.  Your pain is uncontrolled with medicine.  You start to vomit or have diarrhea.  You feel faint or develop a  headache with confusion.  You develop severe blistering.  You have a pus-like (purulent) discharge coming from the blisters.  Your burn becomes more painful and swollen. MAKE SURE YOU:  Understand these instructions.  Will watch your condition.  Will get help right away if you are not doing well or get worse. Document Released: 11/05/2004 Document Revised: 05/23/2012 Document Reviewed: 07/20/2010 ExitCare Patient Information 2015 [employer]. This information is not intended to replace advice given to you by your health care provider. Make sure you discuss any questions you have with your health care provider.  

## 2014-09-04 NOTE — ED Notes (Signed)
Patient's torso is reddened all over. Patient is itching. The itching is out of control. Patient would like something to relieve it.

## 2014-09-04 NOTE — ED Notes (Signed)
Pt states he got sunburnt on Saturday  Pt states it is hurting and itching like crazy  Pt states he has tried ointment and has taken 4 benadryl since one am without relief

## 2014-09-04 NOTE — ED Provider Notes (Signed)
CSN: [id number]     Arrival date & time 09/04/14  0251 History   This chart was scribed for Loren Racer, MD by Arlan Organ, ED Scribe. This patient was seen in room WA14/WA14 and the patient's care was started 3:13 AM.   Chief Complaint  Patient presents with  . Sunburn   The history is provided by the patient. No language interpreter was used.    HPI Comments: Scott Horton is a 44 y.o. male without any pertinent past medical history who presents to the Emergency Department here for sunburn to the abdomen, chest, and upper arms bilaterally x 2 days. Pt reports burning and itching to the skin. He states he was out in the sun a few days ago after wearing 70 SPF sunscreen. However, pt states he jumped into the pool and suspects his sunscreen was not waterproof. OTC A&D cream, aloe vera, and ointments attempted prior to arrival without any improvement. No recent fever or chills. Pt with known allergy to Hydrocodone.  Past Medical History  Diagnosis Date  . Complication of anesthesia     his left eye was scratched by the tape during surgery  . Anxiety     history of  in the past, doesn't take anything now  . GERD (gastroesophageal reflux disease)    Past Surgical History  Procedure Laterality Date  . Back surgery  07/2011  . Lumbar fusion  07/2013  . Knee surgery Left 2006    torn menicus  . Shoulder surgery Right 20 yrs. ago  . Sacroiliac joint fusion Left 02/15/2014    Procedure: SACROILIAC JOINT FUSION;  Surgeon: Emilee Hero, MD;  Location: [facility] OR;  Service: Orthopedics;  Laterality: Left;  Left sided sacroiliac joint fusion   Family History  Problem Relation Age of Onset  . CAD Other    History  Substance Use Topics  . Smoking status: Former Smoker -- 1.00 packs/day for 20 years    Quit date: 01/02/2010  . Smokeless tobacco: Current User    Types: Chew  . Alcohol Use: Yes     Comment: social    Review of Systems  Constitutional: Negative for fever and chills.   Respiratory: Negative for cough and shortness of breath.   Cardiovascular: Negative for chest pain.  Gastrointestinal: Negative for nausea, vomiting and abdominal pain.  Skin: Positive for color change.  Psychiatric/Behavioral: Negative for confusion.  All other systems reviewed and are negative.     Allergies  Hydrocodone  Home Medications   Prior to Admission medications   Medication Sig Start Date End Date Taking? Authorizing Provider  hydrOXYzine (ATARAX/VISTARIL) 25 MG tablet Take 1 tablet (25 mg total) by mouth every 6 (six) hours as needed for itching. 09/04/14   Loren Racer, MD   Triage Vitals: BP 141/90 mmHg  Pulse 98  Temp(Src) 97.7 F (36.5 C) (Oral)  Resp 20  SpO2 97%   Physical Exam  Constitutional: He is oriented to person, place, and time. He appears well-developed and well-nourished. No distress.  HENT:  Head: Normocephalic and atraumatic.  Mouth/Throat: Oropharynx is clear and moist.  Eyes: EOM are normal. Pupils are equal, round, and reactive to light.  Neck: Normal range of motion. Neck supple.  Cardiovascular: Normal rate and regular rhythm.   Pulmonary/Chest: Effort normal and breath sounds normal.  Abdominal: Soft. Bowel sounds are normal.  Musculoskeletal: Normal range of motion. He exhibits no edema or tenderness.  Neurological: He is alert and oriented to person, place, and time.  Skin: Skin is warm and dry. No rash noted. There is erythema.  First degree sunburn to the chest abdomen and back.  Psychiatric: He has a normal mood and affect. His behavior is normal.  Nursing note and vitals reviewed.   ED Course  Procedures (including critical care time)  DIAGNOSTIC STUDIES: Oxygen Saturation is 97% on RA, adequate by my interpretation.    COORDINATION OF CARE: 3:20 AM- Will give Atarax. Discussed treatment plan with pt at bedside and pt agreed to plan.     Labs Review Labs Reviewed - No data to display  Imaging Review No results  found.   EKG Interpretation None      MDM   Final diagnoses:  Sunburn    I personally performed the services described in this documentation, which was scribed in my presence. The recorded information has been reviewed and is accurate.  Will treat symptomatically. Warned of the dangers of prolonged sun exposure. Return precautions given.  Loren Racer, MD 09/04/14 [id number]

## 2018-11-11 ENCOUNTER — Other Ambulatory Visit: Payer: Self-pay

## 2018-11-11 DIAGNOSIS — Z20822 Contact with and (suspected) exposure to covid-19: Secondary | ICD-10-CM

## 2018-11-12 LAB — NOVEL CORONAVIRUS, NAA: SARS-CoV-2, NAA: NOT DETECTED

## 2019-03-29 ENCOUNTER — Ambulatory Visit: Payer: BLUE CROSS/BLUE SHIELD | Attending: Internal Medicine

## 2019-03-29 DIAGNOSIS — Z20822 Contact with and (suspected) exposure to covid-19: Secondary | ICD-10-CM

## 2019-03-31 LAB — NOVEL CORONAVIRUS, NAA: SARS-CoV-2, NAA: NOT DETECTED

## 2020-05-06 ENCOUNTER — Other Ambulatory Visit: Payer: Self-pay

## 2020-05-06 ENCOUNTER — Encounter: Payer: Self-pay | Admitting: Family Medicine

## 2020-05-06 ENCOUNTER — Ambulatory Visit: Payer: Self-pay | Admitting: Family Medicine

## 2020-05-06 DIAGNOSIS — M545 Low back pain, unspecified: Secondary | ICD-10-CM | POA: Insufficient documentation

## 2020-05-06 DIAGNOSIS — M25562 Pain in left knee: Secondary | ICD-10-CM

## 2020-05-06 NOTE — Progress Notes (Signed)
Office Visit Note   Patient: Scott Horton           Date of Birth: 03-07-1970           MRN: [id number] Visit Date: 05/06/2020 Requested by: No referring provider defined for this encounter. PCP: Patient, No Pcp Per  Subjective: Chief Complaint  Patient presents with  . Left Knee - Pain    Twisted knee last week. Knee swelled after that and has been sore and swollen since. Walks with a limp. H/o surgery x 2 on the left knee around 2006.    HPI: He is here with left knee pain.  He has a history of knee surgery x2 in 2003 per Dr. Eulah Horton.  His knee has never been normal after that, but it has been tolerable.  A few years ago he saw one of our surgeons at the other office, had his knee aspirated and injected and he had good relief for about a year.  Recently he was walking on uneven surface and twisted his knee slightly, did not fall.  It has been swollen and moderately painful since then.                ROS:   All other systems were reviewed and are negative.  Objective: Vital Signs: There were no vitals taken for this visit.  Physical Exam:  General:  Alert and oriented, in no acute distress. Pulm:  Breathing unlabored. Psy:  Normal mood, congruent affect. Skin: No erythema or warmth Left knee: 1+ effusion, 2+ patellofemoral crepitus.  Slight flexion contracture, slight laxity with anterior drawer.  Tender on the medial joint line with pain and a palpable click on McMurray's.  Imaging: No results found.  Assessment & Plan: 1.  Left knee osteoarthritis with effusion -Elected to request approval for Trivisc injections.  When approved, he will come in for aspiration and the first of 3 injections.     Procedures: No procedures performed        PMFS History: Patient Active Problem List   Diagnosis Date Noted  . Low back pain 05/06/2020  . Status post lumbar spinal fusion 07/07/2012  . Aftercare following surgery of the musculoskeletal system, NEC 07/07/2012  . Disc  degeneration, lumbar 06/17/2012  . Degenerative lumbar spinal stenosis 06/17/2012  . ANXIETY 01/12/2007  . DEPRESSION 01/12/2007  . GERD 01/12/2007  . SHOULDER PAIN, RIGHT 01/12/2007   Past Medical History:  Diagnosis Date  . Anxiety    history of  in the past, doesn't take anything now  . Complication of anesthesia    his left eye was scratched by the tape during surgery  . GERD (gastroesophageal reflux disease)     Family History  Problem Relation Age of Onset  . CAD Other     Past Surgical History:  Procedure Laterality Date  . BACK SURGERY  07/2011  . KNEE SURGERY Left 2006   torn menicus  . LUMBAR FUSION  07/2013  . SACROILIAC JOINT FUSION Left 02/15/2014   Procedure: SACROILIAC JOINT FUSION;  Surgeon: Scott Hero, MD;  Location: [facility] OR;  Service: Orthopedics;  Laterality: Left;  Left sided sacroiliac joint fusion  . SHOULDER SURGERY Right 20 yrs. ago   Social History   Occupational History  . Not on file  Tobacco Use  . Smoking status: Former Smoker    Packs/day: 1.00    Years: 20.00    Pack years: 20.00    Quit date: 01/02/2010    Years  since quitting: 10.3  . Smokeless tobacco: Current User    Types: Chew  Substance and Sexual Activity  . Alcohol use: Yes    Comment: social  . Drug use: No  . Sexual activity: Not on file

## 2020-05-13 ENCOUNTER — Telehealth: Payer: Self-pay | Admitting: Family Medicine

## 2020-05-13 ENCOUNTER — Telehealth: Payer: Self-pay

## 2020-05-13 NOTE — Telephone Encounter (Signed)
Pt called and said Dr.Hilts told him since he did not have insurance he would go through a drug rep to get the injection in his left knee. If you could please give him a call back [phone]

## 2020-05-13 NOTE — Telephone Encounter (Signed)
Noted  

## 2020-05-13 NOTE — Telephone Encounter (Signed)
Dr. Prince Rome is requesting Trivisc injections for this patient (left knee), as he is uninsured. Left knee OA & effusion. See the last ov note.

## 2020-05-14 NOTE — Telephone Encounter (Signed)
I called and spoke with patient he is self  pay, no longer has [employer].  He states that Dr. Prince Rome told him he was going to talk to a rep and see what he can do about getting the Trivisc for him.  Patient would like to know where this is at, states that he is in a lot of pain and he doesn't wanto to miss anymore work due to the pain.  Please advise.

## 2020-05-15 NOTE — Telephone Encounter (Signed)
For left knee. Please see notes below and ov note.

## 2020-05-15 NOTE — Telephone Encounter (Signed)
Pt is interested in Trivisc through [facility].

## 2020-05-16 ENCOUNTER — Encounter: Payer: Self-pay | Admitting: Family Medicine

## 2020-05-16 NOTE — Telephone Encounter (Signed)
Noted  

## 2020-05-17 ENCOUNTER — Telehealth: Payer: Self-pay

## 2020-05-17 NOTE — Telephone Encounter (Signed)
Talked with patient concerning gel injection. Submitted for TriVisc, left knee online.

## 2020-05-27 ENCOUNTER — Ambulatory Visit (INDEPENDENT_AMBULATORY_CARE_PROVIDER_SITE_OTHER): Payer: BLUE CROSS/BLUE SHIELD | Admitting: Family Medicine

## 2020-05-27 ENCOUNTER — Other Ambulatory Visit: Payer: Self-pay

## 2020-05-27 ENCOUNTER — Encounter: Payer: Self-pay | Admitting: Family Medicine

## 2020-05-27 DIAGNOSIS — M1712 Unilateral primary osteoarthritis, left knee: Secondary | ICD-10-CM

## 2020-05-27 DIAGNOSIS — M25562 Pain in left knee: Secondary | ICD-10-CM

## 2020-05-27 NOTE — Progress Notes (Signed)
Subjective: He is here for planned Trivisc injection #1 for left knee osteoarthritis.  Objective: Trace effusion with no warmth or erythema.  Flexion contracture present.  Procedure: Left knee injection: After sterile prep with Betadine, injected 3 cc 0.25% bupivacaine and Trivisc from superolateral approach, a flash of clear yellow synovial fluid was obtained prior to injection confirming intra-articular placement.

## 2020-06-03 ENCOUNTER — Ambulatory Visit: Payer: BLUE CROSS/BLUE SHIELD | Admitting: Family Medicine

## 2020-06-03 ENCOUNTER — Encounter: Payer: Self-pay | Admitting: Family Medicine

## 2020-06-03 ENCOUNTER — Other Ambulatory Visit: Payer: Self-pay

## 2020-06-03 DIAGNOSIS — M25562 Pain in left knee: Secondary | ICD-10-CM

## 2020-06-03 DIAGNOSIS — M1712 Unilateral primary osteoarthritis, left knee: Secondary | ICD-10-CM

## 2020-06-03 NOTE — Progress Notes (Signed)
I saw and examined the patient with Dr. Marga Hoots and agree with assessment and plan as outlined.    Trivisc #2 given into left knee today without complication.  Return in a week for the 3rd.

## 2020-06-03 NOTE — Progress Notes (Signed)
Office Visit Note   Patient: Scott Horton           Date of Birth: 1970-11-13           MRN: [id number] Visit Date: 06/03/2020 Requested by: No referring provider defined for this encounter. PCP: Patient, No Pcp Per (Inactive)  Subjective: Chief Complaint  Patient presents with  . Left Knee - Follow-up    Trivisc injection #2. No problems from the first injection. He said he is having less pain with walking and at night now.    HPI: 50yo M presenting to clinic for second of three Trivisc injections to his left knee. Patient states his knee felt better almost immediately following the first injection, and no longer feels to be in constant discomfort. He is also happy that it no longer wakes him up in the middle of the night. He continues to have some discomfort beneath the kneecap, which troubles him primarily because he is on his feet all day working on [employer]. He is otherwise doing well, with no additional concerns.               ROS:   All other systems were reviewed and are negative.  Objective: Vital Signs: There were no vitals taken for this visit.  Physical Exam:  General:  Alert and oriented, in no acute distress. Pulm:  Breathing unlabored. Psy:  Normal mood, congruent affect. Skin:  Left knee overlying skin intact, no erythema, no bruises, no rashes.   Left knee with full ROM. No significant tenderness. Normal gait.   Imaging: No results found.  Assessment & Plan: 50yo M presenting to clinic for second of three Trivisc injections. Already noticing some improvement after first injection. Procedure performed as described below, which patient tolerated very well. No additional concerns today.      Procedures: Left Knee Trivisc Injection:  Risks and benefits of procedure discussed, Patient opted to proceed. Verbal Consent obtained.  Timeout performed.  Skin prepped in a sterile fashion with betadine before further cleansing with alcohol. Ethyl Chloride was  used for topical analgesia.  Left Knee was injected with 3cc 0.25% Bupivacaine without epinephrine via the suprapatellar approach using a 25G, 1.5in needle. Syringe was removed from the needle, and Trivisc product was then injected into the joint per manufacturer protocol.   Patient tolerated the injection well with no immediate complications. Aftercare instructions were discussed, and patient was given strict return precautions.     PMFS History: Patient Active Problem List   Diagnosis Date Noted  . Low back pain 05/06/2020  . Status post lumbar spinal fusion 07/07/2012  . Aftercare following surgery of the musculoskeletal system, NEC 07/07/2012  . Disc degeneration, lumbar 06/17/2012  . Degenerative lumbar spinal stenosis 06/17/2012  . ANXIETY 01/12/2007  . DEPRESSION 01/12/2007  . GERD 01/12/2007  . SHOULDER PAIN, RIGHT 01/12/2007   Past Medical History:  Diagnosis Date  . Anxiety    history of  in the past, doesn't take anything now  . Complication of anesthesia    his left eye was scratched by the tape during surgery  . GERD (gastroesophageal reflux disease)     Family History  Problem Relation Age of Onset  . CAD Other     Past Surgical History:  Procedure Laterality Date  . BACK SURGERY  07/2011  . KNEE SURGERY Left 2006   torn menicus  . LUMBAR FUSION  07/2013  . SACROILIAC JOINT FUSION Left 02/15/2014   Procedure: SACROILIAC JOINT FUSION;  Surgeon: Emilee Hero, MD;  Location: [facility] OR;  Service: Orthopedics;  Laterality: Left;  Left sided sacroiliac joint fusion  . SHOULDER SURGERY Right 20 yrs. ago   Social History   Occupational History  . Not on file  Tobacco Use  . Smoking status: Former Smoker    Packs/day: 1.00    Years: 20.00    Pack years: 20.00    Quit date: 01/02/2010    Years since quitting: 10.4  . Smokeless tobacco: Current User    Types: Chew  Substance and Sexual Activity  . Alcohol use: Yes    Comment: social  . Drug use: No  .  Sexual activity: Not on file

## 2020-06-10 ENCOUNTER — Other Ambulatory Visit: Payer: Self-pay

## 2020-06-10 ENCOUNTER — Ambulatory Visit: Payer: Self-pay | Admitting: Family Medicine

## 2020-06-10 ENCOUNTER — Encounter: Payer: Self-pay | Admitting: Family Medicine

## 2020-06-10 DIAGNOSIS — M25562 Pain in left knee: Secondary | ICD-10-CM

## 2020-06-10 NOTE — Progress Notes (Signed)
   Office Visit Note   Patient: Scott Horton           Date of Birth: 1971/01/25           MRN: [id number] Visit Date: 06/10/2020 Requested by: No referring provider defined for this encounter. PCP: Patient, No Pcp Per (Inactive)  Subjective: Chief Complaint  Patient presents with  . Left Knee - Follow-up    Trivisc #3    HPI: He is here for Trivisc #3 for left knee osteoarthritis.  Significant improvement in pain so far.              ROS:   All other systems were reviewed and are negative.  Objective: Vital Signs: There were no vitals taken for this visit.  Physical Exam:  General:  Alert and oriented, in no acute distress. Pulm:  Breathing unlabored. Psy:  Normal mood, congruent affect. Skin: No erythema Left knee: Trace effusion, slight flexion contracture.  No warmth.  1+ patellofemoral crepitus with no tenderness to palpation.  Imaging: No results found.  Assessment & Plan: 1.  Left knee osteoarthritis -Final injection today.  Follow-up as needed.     Procedures: Left knee injection: After sterile prep with Betadine, injected 3 cc 0.25% bupivacaine and Trivisc from lateral midpatellar approach, a flash of clear yellow synovial fluid was obtained prior to injection.       PMFS History: Patient Active Problem List   Diagnosis Date Noted  . Low back pain 05/06/2020  . Status post lumbar spinal fusion 07/07/2012  . Aftercare following surgery of the musculoskeletal system, NEC 07/07/2012  . Disc degeneration, lumbar 06/17/2012  . Degenerative lumbar spinal stenosis 06/17/2012  . ANXIETY 01/12/2007  . DEPRESSION 01/12/2007  . GERD 01/12/2007  . SHOULDER PAIN, RIGHT 01/12/2007   Past Medical History:  Diagnosis Date  . Anxiety    history of  in the past, doesn't take anything now  . Complication of anesthesia    his left eye was scratched by the tape during surgery  . GERD (gastroesophageal reflux disease)     Family History  Problem Relation Age  of Onset  . CAD Other     Past Surgical History:  Procedure Laterality Date  . BACK SURGERY  07/2011  . KNEE SURGERY Left 2006   torn menicus  . LUMBAR FUSION  07/2013  . SACROILIAC JOINT FUSION Left 02/15/2014   Procedure: SACROILIAC JOINT FUSION;  Surgeon: Emilee Hero, MD;  Location: [facility] OR;  Service: Orthopedics;  Laterality: Left;  Left sided sacroiliac joint fusion  . SHOULDER SURGERY Right 20 yrs. ago   Social History   Occupational History  . Not on file  Tobacco Use  . Smoking status: Former Smoker    Packs/day: 1.00    Years: 20.00    Pack years: 20.00    Quit date: 01/02/2010    Years since quitting: 10.4  . Smokeless tobacco: Current User    Types: Chew  Substance and Sexual Activity  . Alcohol use: Yes    Comment: social  . Drug use: No  . Sexual activity: Not on file

## 2020-10-25 ENCOUNTER — Emergency Department (HOSPITAL_BASED_OUTPATIENT_CLINIC_OR_DEPARTMENT_OTHER): Payer: Self-pay

## 2020-10-25 ENCOUNTER — Other Ambulatory Visit: Payer: Self-pay

## 2020-10-25 ENCOUNTER — Encounter (HOSPITAL_BASED_OUTPATIENT_CLINIC_OR_DEPARTMENT_OTHER): Payer: Self-pay | Admitting: *Deleted

## 2020-10-25 ENCOUNTER — Emergency Department (HOSPITAL_BASED_OUTPATIENT_CLINIC_OR_DEPARTMENT_OTHER)
Admission: EM | Admit: 2020-10-25 | Discharge: 2020-10-25 | Disposition: A | Discharge status: Home / Self Care | Payer: Self-pay | Attending: Emergency Medicine | Admitting: Emergency Medicine

## 2020-10-25 DIAGNOSIS — R079 Chest pain, unspecified: Secondary | ICD-10-CM | POA: Insufficient documentation

## 2020-10-25 DIAGNOSIS — Z20822 Contact with and (suspected) exposure to covid-19: Secondary | ICD-10-CM | POA: Insufficient documentation

## 2020-10-25 DIAGNOSIS — R059 Cough, unspecified: Secondary | ICD-10-CM | POA: Insufficient documentation

## 2020-10-25 DIAGNOSIS — Z87891 Personal history of nicotine dependence: Secondary | ICD-10-CM | POA: Insufficient documentation

## 2020-10-25 DIAGNOSIS — R0602 Shortness of breath: Secondary | ICD-10-CM | POA: Insufficient documentation

## 2020-10-25 LAB — BASIC METABOLIC PANEL
Anion gap: 8 (ref 5–15)
BUN: 21 mg/dL — ABNORMAL HIGH (ref 6–20)
CO2: 24 mmol/L (ref 22–32)
Calcium: 9.2 mg/dL (ref 8.9–10.3)
Chloride: 103 mmol/L (ref 98–111)
Creatinine, Ser: 1.15 mg/dL (ref 0.61–1.24)
GFR, Estimated: >60 mL/min (ref >60)
Glucose, Bld: 151 mg/dL — ABNORMAL HIGH (ref 70–99)
Potassium: 4.1 mmol/L (ref 3.5–5.1)
Sodium: 135 mmol/L (ref 135–145)

## 2020-10-25 LAB — CBC
HCT: 43.3 % (ref 39.0–52.0)
Hemoglobin: 14.7 g/dL (ref 13.0–17.0)
MCH: 29.2 pg (ref 26.0–34.0)
MCHC: 33.9 g/dL (ref 30.0–36.0)
MCV: 86.1 fL (ref 80.0–100.0)
Platelets: 328 10*3/uL (ref 150–400)
RBC: 5.03 MIL/uL (ref 4.22–5.81)
RDW: 12.9 % (ref 11.5–15.5)
WBC: 8.5 10*3/uL (ref 4.0–10.5)
nRBC: 0 % (ref 0.0–0.2)

## 2020-10-25 LAB — RESP PANEL BY RT-PCR (FLU A&B, COVID) ARPGX2
Influenza A by PCR: NEGATIVE
Influenza B by PCR: NEGATIVE
SARS Coronavirus 2 by RT PCR: NEGATIVE

## 2020-10-25 LAB — TROPONIN I (HIGH SENSITIVITY)
Troponin I (High Sensitivity): 4 ng/L (ref <18)
Troponin I (High Sensitivity): 4 ng/L (ref <18)

## 2020-10-25 MED ORDER — DIPHENHYDRAMINE HCL 50 MG/ML IJ SOLN
12.5000 mg | Freq: Once | INTRAMUSCULAR | Status: AC
  Administered 2020-10-25: 12.5 mg via INTRAVENOUS
  Filled 2020-10-25: qty 1

## 2020-10-25 MED ORDER — ALUM & MAG HYDROXIDE-SIMETH 200-200-20 MG/5ML PO SUSP
30.0000 mL | Freq: Once | ORAL | Status: AC
  Administered 2020-10-25: 30 mL via ORAL
  Filled 2020-10-25: qty 30

## 2020-10-25 MED ORDER — PROCHLORPERAZINE EDISYLATE 10 MG/2ML IJ SOLN
5.0000 mg | Freq: Once | INTRAMUSCULAR | Status: AC
  Administered 2020-10-25: 5 mg via INTRAVENOUS
  Filled 2020-10-25: qty 2

## 2020-10-25 NOTE — Discharge Instructions (Signed)
Your COVID test was negative.  This test is not perfect and there is little chance you do have COVID especially if you are exposed to someone recently that had it in her having symptoms.  Your markers of heart damage were both negative.  This makes it very unlikely that you are having a heart attack.  However this test is also not perfect so if you have worsening shortness of breath or discomfort on exertion please return to the ED or have your family doctor evaluate you.  Try pepcid or tagamet up to twice a day.  Try to avoid things that may make this worse, most commonly these are spicy foods tomato based products fatty foods chocolate and peppermint.  Alcohol and tobacco can also make this worse.  Return to the emergency department for sudden worsening pain fever or inability to eat or drink.

## 2020-10-25 NOTE — ED Triage Notes (Signed)
Chest burning sensation with movement, cough and feels as if he has a lump in his throat x 3 days

## 2020-10-25 NOTE — ED Provider Notes (Signed)
[facility] EMERGENCY DEPT Provider Note   CSN: [id number] Arrival date & time: 10/25/20  1441     History Chief Complaint  Patient presents with   Chest Pain   Cough    Scott Horton is a 50 y.o. male.  50 yo M with a chief complaints of cough and chest burning.  Has been going on for about 3 or 4 days.  Seems to come and go.  Had an episode that was severe last night and then today when he was walking to the Emergency Department felt like he was very easily winded and developed worsening of the burning sensation.  A bit better now.  Told me it felt like if he drank a bunch of moonshine.  He also feels like he has a lump in his throat.  Family member tested positive for COVID about 4 to 5 days ago.  He denies significant cough.  Denies fevers.  The history is provided by the patient.  Chest Pain Associated symptoms: cough and shortness of breath   Associated symptoms: no abdominal pain, no fever, no headache, no palpitations and no vomiting   Cough Associated symptoms: chest pain and shortness of breath   Associated symptoms: no chills, no eye discharge, no fever, no headaches, no myalgias and no rash   Illness Severity:  Moderate Onset quality:  Gradual Duration:  3 days Timing:  Constant Progression:  Worsening Chronicity:  New Associated symptoms: chest pain, cough and shortness of breath   Associated symptoms: no abdominal pain, no congestion, no diarrhea, no fever, no headaches, no myalgias, no rash and no vomiting       Past Medical History:  Diagnosis Date   Anxiety    history of  in the past, doesn't take anything now   Complication of anesthesia    his left eye was scratched by the tape during surgery   GERD (gastroesophageal reflux disease)     Patient Active Problem List   Diagnosis Date Noted   Low back pain 05/06/2020   Status post lumbar spinal fusion 07/07/2012   Aftercare following surgery of the musculoskeletal system, NEC  07/07/2012   Disc degeneration, lumbar 06/17/2012   Degenerative lumbar spinal stenosis 06/17/2012   ANXIETY 01/12/2007   DEPRESSION 01/12/2007   GERD 01/12/2007   SHOULDER PAIN, RIGHT 01/12/2007    Past Surgical History:  Procedure Laterality Date   BACK SURGERY  07/2011   KNEE SURGERY Left 2006   torn menicus   LUMBAR FUSION  07/2013   SACROILIAC JOINT FUSION Left 02/15/2014   Procedure: SACROILIAC JOINT FUSION;  Surgeon: Emilee Hero, MD;  Location: [facility] OR;  Service: Orthopedics;  Laterality: Left;  Left sided sacroiliac joint fusion   SHOULDER SURGERY Right 20 yrs. ago       Family History  Problem Relation Age of Onset   CAD Other     Social History   Tobacco Use   Smoking status: Former    Packs/day: 1.00    Years: 20.00    Pack years: 20.00    Types: Cigarettes    Quit date: 01/02/2010    Years since quitting: 10.8   Smokeless tobacco: Current    Types: Chew  Vaping Use   Vaping Use: Every day  Substance Use Topics   Alcohol use: Yes    Comment: social   Drug use: No    Home Medications Prior to Admission medications   Not on File    Allergies  Patient has no known allergies.  Review of Systems   Review of Systems  Constitutional:  Negative for chills and fever.  HENT:  Negative for congestion and facial swelling.   Eyes:  Negative for discharge and visual disturbance.  Respiratory:  Positive for cough and shortness of breath.   Cardiovascular:  Positive for chest pain. Negative for palpitations.  Gastrointestinal:  Negative for abdominal pain, diarrhea and vomiting.  Musculoskeletal:  Negative for arthralgias and myalgias.  Skin:  Negative for color change and rash.  Neurological:  Negative for tremors, syncope and headaches.  Psychiatric/Behavioral:  Negative for confusion and dysphoric mood.    Physical Exam Updated Vital Signs BP (!) 104/53   Pulse (!) 57   Temp 98.1 F (36.7 C)   Resp 15   Ht 5\' 9"  (1.753 m)   Wt 102.1 kg    SpO2 93%   BMI 33.23 kg/m   Physical Exam Vitals and nursing note reviewed.  Constitutional:      Appearance: He is well-developed.  HENT:     Head: Normocephalic and atraumatic.  Eyes:     Pupils: Pupils are equal, round, and reactive to light.  Neck:     Vascular: No JVD.  Cardiovascular:     Rate and Rhythm: Normal rate and regular rhythm.     Heart sounds: No murmur heard.   No friction rub. No gallop.  Pulmonary:     Effort: No respiratory distress.     Breath sounds: No wheezing.  Abdominal:     General: There is no distension.     Tenderness: There is no abdominal tenderness. There is no guarding or rebound.  Musculoskeletal:        General: Normal range of motion.     Cervical back: Normal range of motion and neck supple.  Skin:    Coloration: Skin is not pale.     Findings: No rash.  Neurological:     Mental Status: He is alert and oriented to person, place, and time.  Psychiatric:        Behavior: Behavior normal.    ED Results / Procedures / Treatments   Labs (all labs ordered are listed, but only abnormal results are displayed) Labs Reviewed  BASIC METABOLIC PANEL - Abnormal; Notable for the following components:      Result Value   Glucose, Bld 151 (*)    BUN 21 (*)    All other components within normal limits  RESP PANEL BY RT-PCR (FLU A&B, COVID) ARPGX2  CBC  TROPONIN I (HIGH SENSITIVITY)  TROPONIN I (HIGH SENSITIVITY)    EKG EKG Interpretation  Date/Time:  Friday October 25 2020 15:01:39 EDT Ventricular Rate:  77 PR Interval:  148 QRS Duration: 74 QT Interval:  356 QTC Calculation: 402 R Axis:   63 Text Interpretation: Normal sinus rhythm Possible Anterior infarct , age undetermined Abnormal ECG No significant change since last tracing Confirmed by 09-04-2004 [phone]) on 10/25/2020 4:27:00 PM  Radiology DG Chest 2 View  Result Date: 10/25/2020 CLINICAL DATA:  Chest pain. EXAM: CHEST - 2 VIEW COMPARISON:  February 14, 2014. FINDINGS:  The heart size and mediastinal contours are within normal limits. Both lungs are clear. The visualized skeletal structures are unremarkable. IMPRESSION: No active cardiopulmonary disease. Electronically Signed   By: February 16, 2014 M.D.   On: 10/25/2020 16:57    Procedures Procedures   Medications Ordered in ED Medications  prochlorperazine (COMPAZINE) injection 5 mg (5 mg Intravenous Given 10/25/20  1641)  diphenhydrAMINE (BENADRYL) injection 12.5 mg (12.5 mg Intravenous Given 10/25/20 1641)  alum & mag hydroxide-simeth (MAALOX/MYLANTA) 200-200-20 MG/5ML suspension 30 mL (30 mLs Oral Given 10/25/20 1648)    ED Course  I have reviewed the triage vital signs and the nursing notes.  Pertinent labs & imaging results that were available during my care of the patient were reviewed by me and considered in my medical decision making (see chart for details).    MDM Rules/Calculators/A&P                           50 yo M with a cc of chest discomfort.  Described as a burning.  Patient exposed to someone with COVID recently.  Certainly could be COVID-related.  Sounds also possibly reflux.  Initial troponin is negative.  He had significant worsening of his symptoms just before arrival.  We will obtain a delta.  GI cocktail.  Reassess.  Delta troponin is negative.  Will discharge patient home.  Trial of H2 blockers.  PCP follow-up.  6:23 PM:  I have discussed the diagnosis/risks/treatment options with the patient and family and believe the pt to be eligible for discharge home to follow-up with PCP. We also discussed returning to the ED immediately if new or worsening sx occur. We discussed the sx which are most concerning (e.g., sudden worsening pain, fever, inability to tolerate by mouth) that necessitate immediate return. Medications administered to the patient during their visit and any new prescriptions provided to the patient are listed below.  Medications given during this visit Medications   prochlorperazine (COMPAZINE) injection 5 mg (5 mg Intravenous Given 10/25/20 1641)  diphenhydrAMINE (BENADRYL) injection 12.5 mg (12.5 mg Intravenous Given 10/25/20 1641)  alum & mag hydroxide-simeth (MAALOX/MYLANTA) 200-200-20 MG/5ML suspension 30 mL (30 mLs Oral Given 10/25/20 1648)     The patient appears reasonably screen and/or stabilized for discharge and I doubt any other medical condition or other [facility] requiring further screening, evaluation, or treatment in the ED at this time prior to discharge.   Final Clinical Impression(s) / ED Diagnoses Final diagnoses:  Nonspecific chest pain    Rx / DC Orders ED Discharge Orders     None        Melene Plan, DO 10/25/20 1823

## 2023-06-18 ENCOUNTER — Other Ambulatory Visit (INDEPENDENT_AMBULATORY_CARE_PROVIDER_SITE_OTHER): Payer: Self-pay

## 2023-06-18 ENCOUNTER — Ambulatory Visit (INDEPENDENT_AMBULATORY_CARE_PROVIDER_SITE_OTHER): Payer: Self-pay | Admitting: Physician Assistant

## 2023-06-18 DIAGNOSIS — G8929 Other chronic pain: Secondary | ICD-10-CM

## 2023-06-18 DIAGNOSIS — M25562 Pain in left knee: Secondary | ICD-10-CM

## 2023-06-18 DIAGNOSIS — M1712 Unilateral primary osteoarthritis, left knee: Secondary | ICD-10-CM

## 2023-06-18 MED ORDER — LIDOCAINE HCL 1 % IJ SOLN
2.0000 mL | INTRAMUSCULAR | Status: AC | PRN
  Administered 2023-06-18: 2 mL

## 2023-06-18 MED ORDER — METHYLPREDNISOLONE ACETATE 40 MG/ML IJ SUSP
40.0000 mg | INTRAMUSCULAR | Status: AC | PRN
  Administered 2023-06-18: 40 mg via INTRA_ARTICULAR

## 2023-06-18 MED ORDER — BUPIVACAINE HCL 0.25 % IJ SOLN
2.0000 mL | INTRAMUSCULAR | Status: AC | PRN
  Administered 2023-06-18: 2 mL via INTRA_ARTICULAR

## 2023-06-18 NOTE — Progress Notes (Signed)
 Office Visit Note   Patient: Scott Horton           Date of Birth: [birth year]           MRN: [id number] Visit Date: 06/18/2023              Requested by: No referring provider defined for this encounter. PCP: Patient, No Pcp Per   Assessment & Plan: Visit Diagnoses:  1. Unilateral primary osteoarthritis, left knee     Plan: Impression is left knee osteoarthritis.  Today, we discussed various treatment options to include repeat cortisone injection versus viscosupplementation injection versus total knee arthroplasty.  He currently does not have insurance and would like to look into this before proceeding with surgery.  He would like to proceed with left knee aspiration and cortisone injection today.  Total knee replacement handout provided.  Follow-up as needed.  Follow-Up Instructions: Return if symptoms worsen or fail to improve.   Orders:  Orders Placed This Encounter  Procedures   Large Joint Inj   XR KNEE 3 VIEW LEFT   No orders of the defined types were placed in this encounter.     Procedures: Large Joint Inj: L knee on 06/18/2023 1:51 PM Indications: pain Details: 22 G needle, anterolateral approach Medications: 2 mL lidocaine  1 %; 2 mL bupivacaine  0.25 %; 40 mg methylPREDNISolone acetate 40 MG/ML      Clinical Data: No additional findings.   Subjective: Chief Complaint  Patient presents with   Left Knee - Pain    HPI patient is a very pleasant 53 year old gentleman who comes in today with left knee pain.  Symptoms began several years ago and have recently worsened.  The pain he has is to the entire aspect of the left knee.  He has associated swelling.  Symptoms are worse with knee flexion as well as at night.  He has tried Aleve with minimal relief.  He has previously undergone cortisone and gel injections with only temporary relief.  Review of Systems as detailed in HPI.  All others reviewed and are negative.   Objective: Vital Signs: There were no  vitals taken for this visit.  Physical Exam well-developed well-nourished gentleman in no acute distress.  Alert and oriented x 3.  Ortho Exam left knee exam: Varus deformity.  Small to moderate effusion.  No joint line tenderness.  Moderate patellofemoral crepitus.  He is neurovascularly intact distally.  Specialty Comments:  No specialty comments available.  Imaging: XR KNEE 3 VIEW LEFT Result Date: 06/18/2023 Advanced tricompartmental degenerative changes    PMFS History: Patient Active Problem List   Diagnosis Date Noted   Low back pain 05/06/2020   Status post lumbar spinal fusion 07/07/2012   Aftercare following surgery of the musculoskeletal system, NEC 07/07/2012   Disc degeneration, lumbar 06/17/2012   Degenerative lumbar spinal stenosis 06/17/2012   ANXIETY 01/12/2007   DEPRESSION 01/12/2007   GERD 01/12/2007   SHOULDER PAIN, RIGHT 01/12/2007   Past Medical History:  Diagnosis Date   Anxiety    history of  in the past, doesn't take anything now   Complication of anesthesia    his left eye was scratched by the tape during surgery   GERD (gastroesophageal reflux disease)     Family History  Problem Relation Age of Onset   CAD Other     Past Surgical History:  Procedure Laterality Date   BACK SURGERY  07/2011   KNEE SURGERY Left 2006   torn menicus  LUMBAR FUSION  07/2013   SACROILIAC JOINT FUSION Left 02/15/2014   Procedure: SACROILIAC JOINT FUSION;  Surgeon: Estevan Helper, MD;  Location: [facility] OR;  Service: Orthopedics;  Laterality: Left;  Left sided sacroiliac joint fusion   SHOULDER SURGERY Right 20 yrs. ago   Social History   Occupational History   Not on file  Tobacco Use   Smoking status: Former    Current packs/day: 0.00    Average packs/day: 1 pack/day for 20.0 years (20.0 ttl pk-yrs)    Types: Cigarettes    Start date: 01/02/1990    Quit date: 01/02/2010    Years since quitting: 13.4   Smokeless tobacco: Current    Types: Chew  Vaping  Use   Vaping status: Every Day  Substance and Sexual Activity   Alcohol use: Yes    Comment: social   Drug use: No   Sexual activity: Not on file

## 2023-08-03 ENCOUNTER — Telehealth: Payer: Self-pay | Admitting: Physician Assistant

## 2023-08-03 NOTE — Telephone Encounter (Signed)
 Patient called and said his left is in pain is there anything you can do for his pain. CB#[phone]

## 2023-08-04 ENCOUNTER — Telehealth: Payer: Self-pay

## 2023-08-04 ENCOUNTER — Other Ambulatory Visit: Payer: Self-pay | Admitting: Physician Assistant

## 2023-08-04 MED ORDER — TRAMADOL HCL 50 MG PO TABS: 50.0000 mg | ORAL_TABLET | Freq: Three times a day (TID) | ORAL | 0 refills | Status: AC | PRN

## 2023-08-04 NOTE — Telephone Encounter (Signed)
 Submitted online through [facility] portal for TriVisc, left knee.

## 2023-08-04 NOTE — Telephone Encounter (Signed)
 How soon can this get approved?

## 2023-08-04 NOTE — Telephone Encounter (Signed)
 See message below

## 2023-08-04 NOTE — Telephone Encounter (Signed)
 Hard to tell but we can certainly try.  Vertell, if he wants to try, can we submit for approval?

## 2023-08-04 NOTE — Telephone Encounter (Signed)
 See message.

## 2023-08-04 NOTE — Telephone Encounter (Signed)
 Sent in tramadol

## 2023-08-12 ENCOUNTER — Other Ambulatory Visit: Payer: Self-pay

## 2023-08-12 DIAGNOSIS — M1712 Unilateral primary osteoarthritis, left knee: Secondary | ICD-10-CM

## 2023-08-12 NOTE — Telephone Encounter (Signed)
 Patient has been scheduled by April for the injections starting next Tuesday 08/17/2023.

## 2023-08-17 ENCOUNTER — Ambulatory Visit (INDEPENDENT_AMBULATORY_CARE_PROVIDER_SITE_OTHER): Payer: Self-pay | Admitting: Physician Assistant

## 2023-08-17 ENCOUNTER — Encounter: Payer: Self-pay | Admitting: Physician Assistant

## 2023-08-17 DIAGNOSIS — M1712 Unilateral primary osteoarthritis, left knee: Secondary | ICD-10-CM

## 2023-08-17 MED ORDER — SODIUM HYALURONATE (VISCOSUP) 25 MG/2.5ML IX SOSY
25.0000 mg | PREFILLED_SYRINGE | INTRA_ARTICULAR | Status: AC | PRN
  Administered 2023-08-17: 25 mg via INTRA_ARTICULAR

## 2023-08-17 MED ORDER — LIDOCAINE HCL 1 % IJ SOLN
2.0000 mL | INTRAMUSCULAR | Status: AC | PRN
  Administered 2023-08-17: 2 mL

## 2023-08-17 MED ORDER — BUPIVACAINE HCL 0.25 % IJ SOLN
2.0000 mL | INTRAMUSCULAR | Status: AC | PRN
  Administered 2023-08-17: 2 mL via INTRA_ARTICULAR

## 2023-08-17 NOTE — Progress Notes (Signed)
 Office Visit Note   Patient: Scott Horton           Date of Birth: 12-Nov-1970           MRN: [id number] Visit Date: 08/17/2023              Requested by: Jerri Kay HERO, MD [address] [place],  [place] [id number] PCP: Patient, No Pcp Per   Assessment & Plan: Visit Diagnoses:  1. Unilateral primary osteoarthritis, left knee     Plan: Impression is left knee osteoarthritis.  Today, proceed with left knee TriVisc injection.  He tolerated this well.  Follow-up next week for a second injection.  Call with concerns or questions.  Follow-Up Instructions: Return in about 1 week (around 08/24/2023).   Orders:  Orders Placed This Encounter  Procedures   Large Joint Inj: L knee   No orders of the defined types were placed in this encounter.     Procedures: Large Joint Inj: L knee on 08/17/2023 2:04 PM Indications: pain Details: 22 G needle, anterolateral approach Medications: 2 mL lidocaine  1 %; 2 mL bupivacaine  0.25 %; 25 mg Sodium Hyaluronate (Viscosup) 25 MG/2.5ML      Clinical Data: No additional findings.   Subjective: Chief Complaint  Patient presents with   Left Knee - Follow-up    Trivisc #1    HPI patient is a pleasant 53 year old gentleman who comes in today for left knee TriVisc injection.     Objective: Vital Signs: There were no vitals taken for this visit.    Ortho Exam unchanged left knee exam  Specialty Comments:  No specialty comments available.  Imaging: No new imaging   PMFS History: Patient Active Problem List   Diagnosis Date Noted   Low back pain 05/06/2020   Status post lumbar spinal fusion 07/07/2012   Aftercare following surgery of the musculoskeletal system, NEC 07/07/2012   Disc degeneration, lumbar 06/17/2012   Degenerative lumbar spinal stenosis 06/17/2012   ANXIETY 01/12/2007   DEPRESSION 01/12/2007   GERD 01/12/2007   SHOULDER PAIN, RIGHT 01/12/2007   Past Medical History:  Diagnosis Date   Anxiety     history of  in the past, doesn't take anything now   Complication of anesthesia    his left eye was scratched by the tape during surgery   GERD (gastroesophageal reflux disease)     Family History  Problem Relation Age of Onset   CAD Other     Past Surgical History:  Procedure Laterality Date   BACK SURGERY  07/2011   KNEE SURGERY Left 2006   torn menicus   LUMBAR FUSION  07/2013   SACROILIAC JOINT FUSION Left 02/15/2014   Procedure: SACROILIAC JOINT FUSION;  Surgeon: Oneil Rodgers Priestly, MD;  Location: [facility] OR;  Service: Orthopedics;  Laterality: Left;  Left sided sacroiliac joint fusion   SHOULDER SURGERY Right 20 yrs. ago   Social History   Occupational History   Not on file  Tobacco Use   Smoking status: Former    Current packs/day: 0.00    Average packs/day: 1 pack/day for 20.0 years (20.0 ttl pk-yrs)    Types: Cigarettes    Start date: 01/02/1990    Quit date: 01/02/2010    Years since quitting: 13.6   Smokeless tobacco: Current    Types: Chew  Vaping Use   Vaping status: Every Day  Substance and Sexual Activity   Alcohol use: Yes    Comment: social   Drug use: No  Sexual activity: Not on file

## 2023-08-24 ENCOUNTER — Encounter: Payer: Self-pay | Admitting: Orthopaedic Surgery

## 2023-08-24 ENCOUNTER — Ambulatory Visit (INDEPENDENT_AMBULATORY_CARE_PROVIDER_SITE_OTHER): Payer: Self-pay | Admitting: Orthopaedic Surgery

## 2023-08-24 DIAGNOSIS — M1712 Unilateral primary osteoarthritis, left knee: Secondary | ICD-10-CM

## 2023-08-24 MED ORDER — SODIUM HYALURONATE (VISCOSUP) 25 MG/2.5ML IX SOSY
25.0000 mg | PREFILLED_SYRINGE | INTRA_ARTICULAR | Status: AC | PRN
  Administered 2023-08-24: 25 mg via INTRA_ARTICULAR

## 2023-08-24 NOTE — Progress Notes (Signed)
   Procedure Note  Patient: Scott Horton             Date of Birth: [birth year]           MRN: [id number]             Visit Date: 08/24/2023  Procedures: Visit Diagnoses:  1. Primary osteoarthritis of left knee     Large Joint Inj: L knee on 08/24/2023 7:31 AM Indications: pain Details: 22 G needle  Arthrogram: No  Medications: 25 mg Sodium Hyaluronate (Viscosup) 25 MG/2.5ML Outcome: tolerated well, no immediate complications Patient was prepped and draped in the usual sterile fashion.     2nd trivsic injection given today.  Follow up next week for third round

## 2023-08-31 ENCOUNTER — Encounter: Payer: Self-pay | Admitting: Orthopaedic Surgery

## 2023-08-31 ENCOUNTER — Ambulatory Visit (INDEPENDENT_AMBULATORY_CARE_PROVIDER_SITE_OTHER): Payer: Self-pay | Admitting: Orthopaedic Surgery

## 2023-08-31 DIAGNOSIS — M1712 Unilateral primary osteoarthritis, left knee: Secondary | ICD-10-CM

## 2023-08-31 MED ORDER — SODIUM HYALURONATE (VISCOSUP) 25 MG/2.5ML IX SOSY
25.0000 mg | PREFILLED_SYRINGE | INTRA_ARTICULAR | Status: AC | PRN
  Administered 2023-08-31: 25 mg via INTRA_ARTICULAR

## 2023-08-31 NOTE — Progress Notes (Signed)
   Procedure Note  Patient: Scott Horton             Date of Birth: 09-06-1970           MRN: [id number]             Visit Date: 08/31/2023  Procedures: Visit Diagnoses:  1. Primary osteoarthritis of left knee     Large Joint Inj: L knee on 08/31/2023 11:58 AM Indications: pain Details: 22 G needle  Arthrogram: No  Medications: 25 mg Sodium Hyaluronate (Viscosup) 25 MG/2.5ML Outcome: tolerated well, no immediate complications Patient was prepped and draped in the usual sterile fashion.     [name] returns today for third round of TriVisc.  So far he has not felt much relief from the injections.

## 2023-09-15 IMAGING — DX DG CHEST 2V
2 series · 2 of 2 positions shown · non-contrast
Comparison: February 14, 2014.

CLINICAL DATA: Chest pain.

EXAM:
CHEST - 2 VIEW

[chest pa]
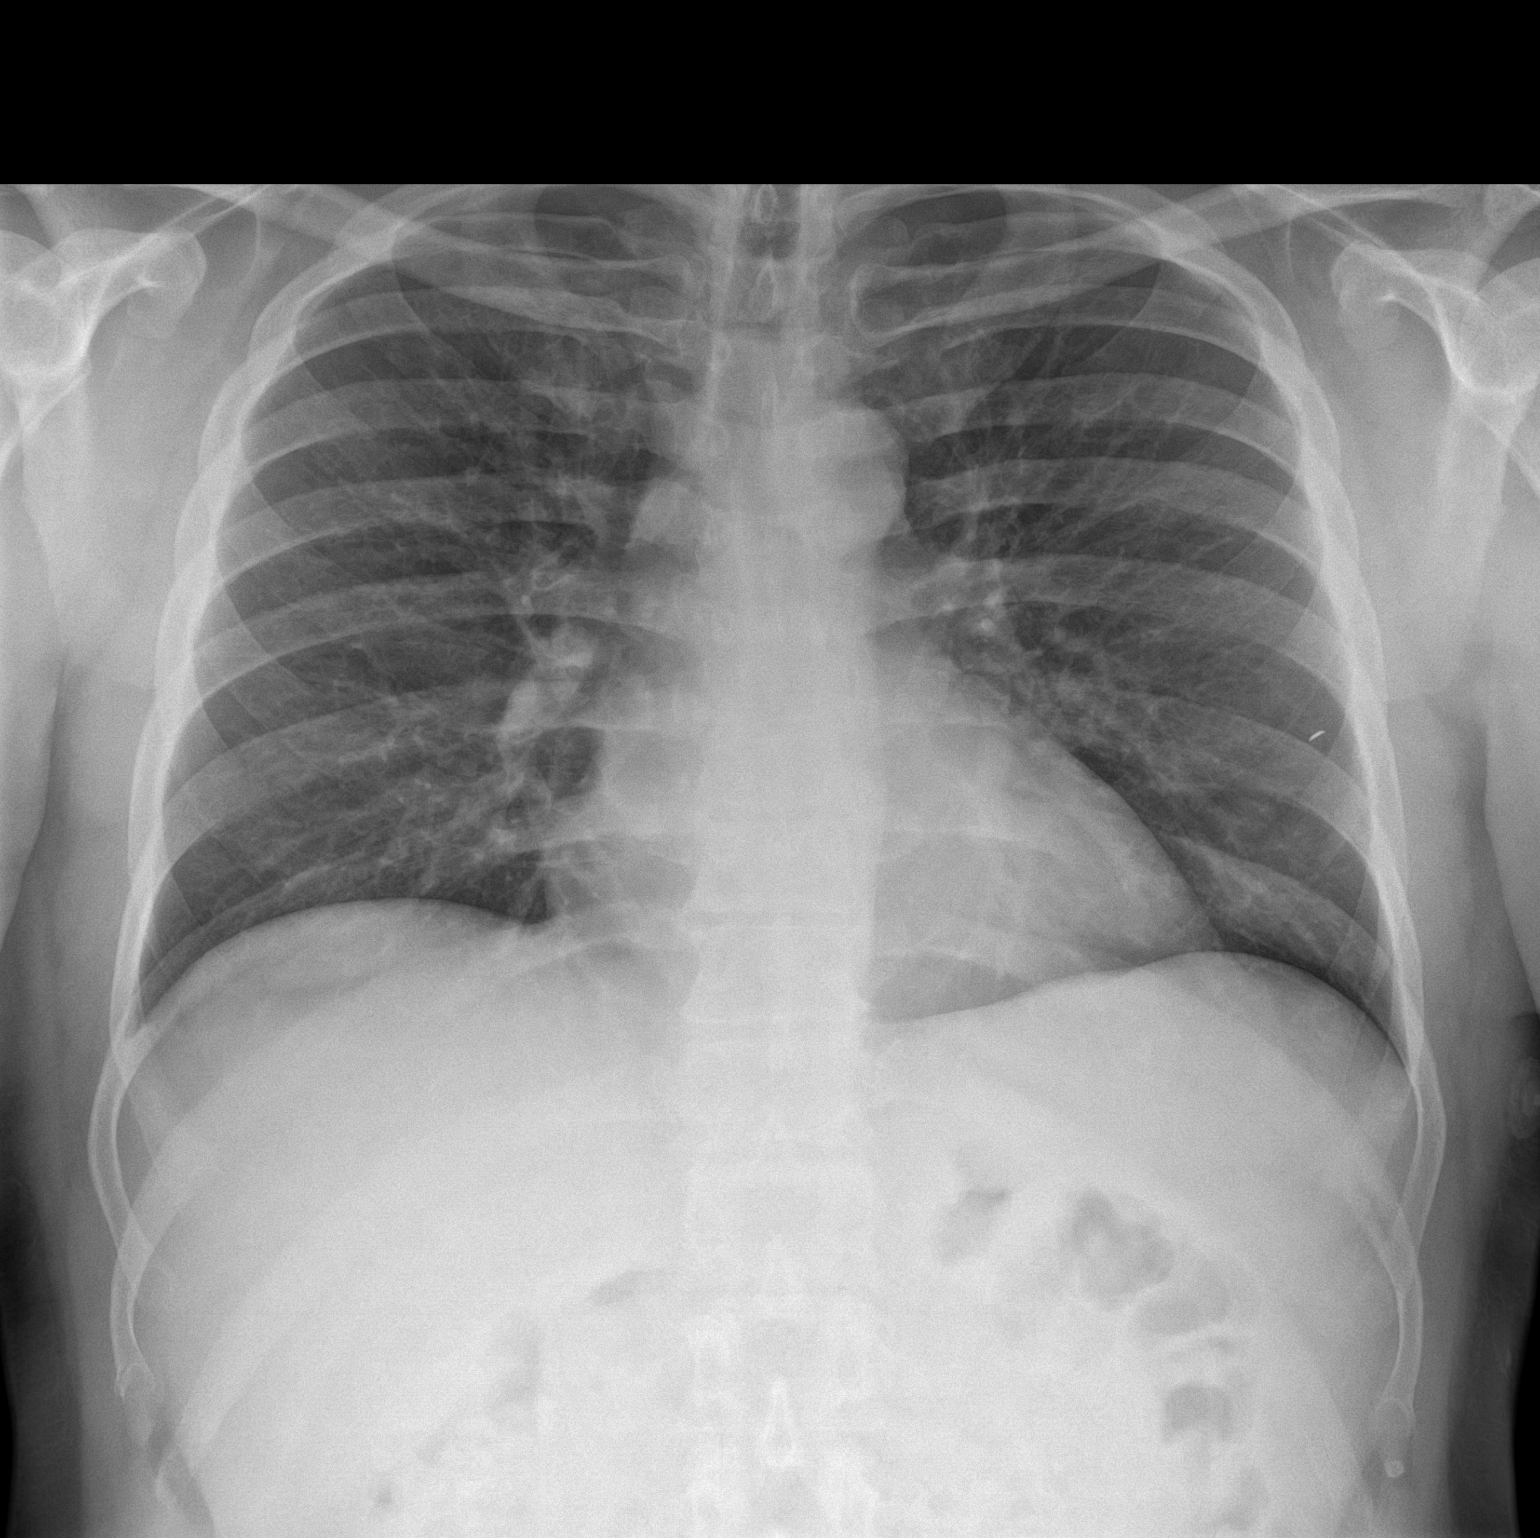

[chest lat]
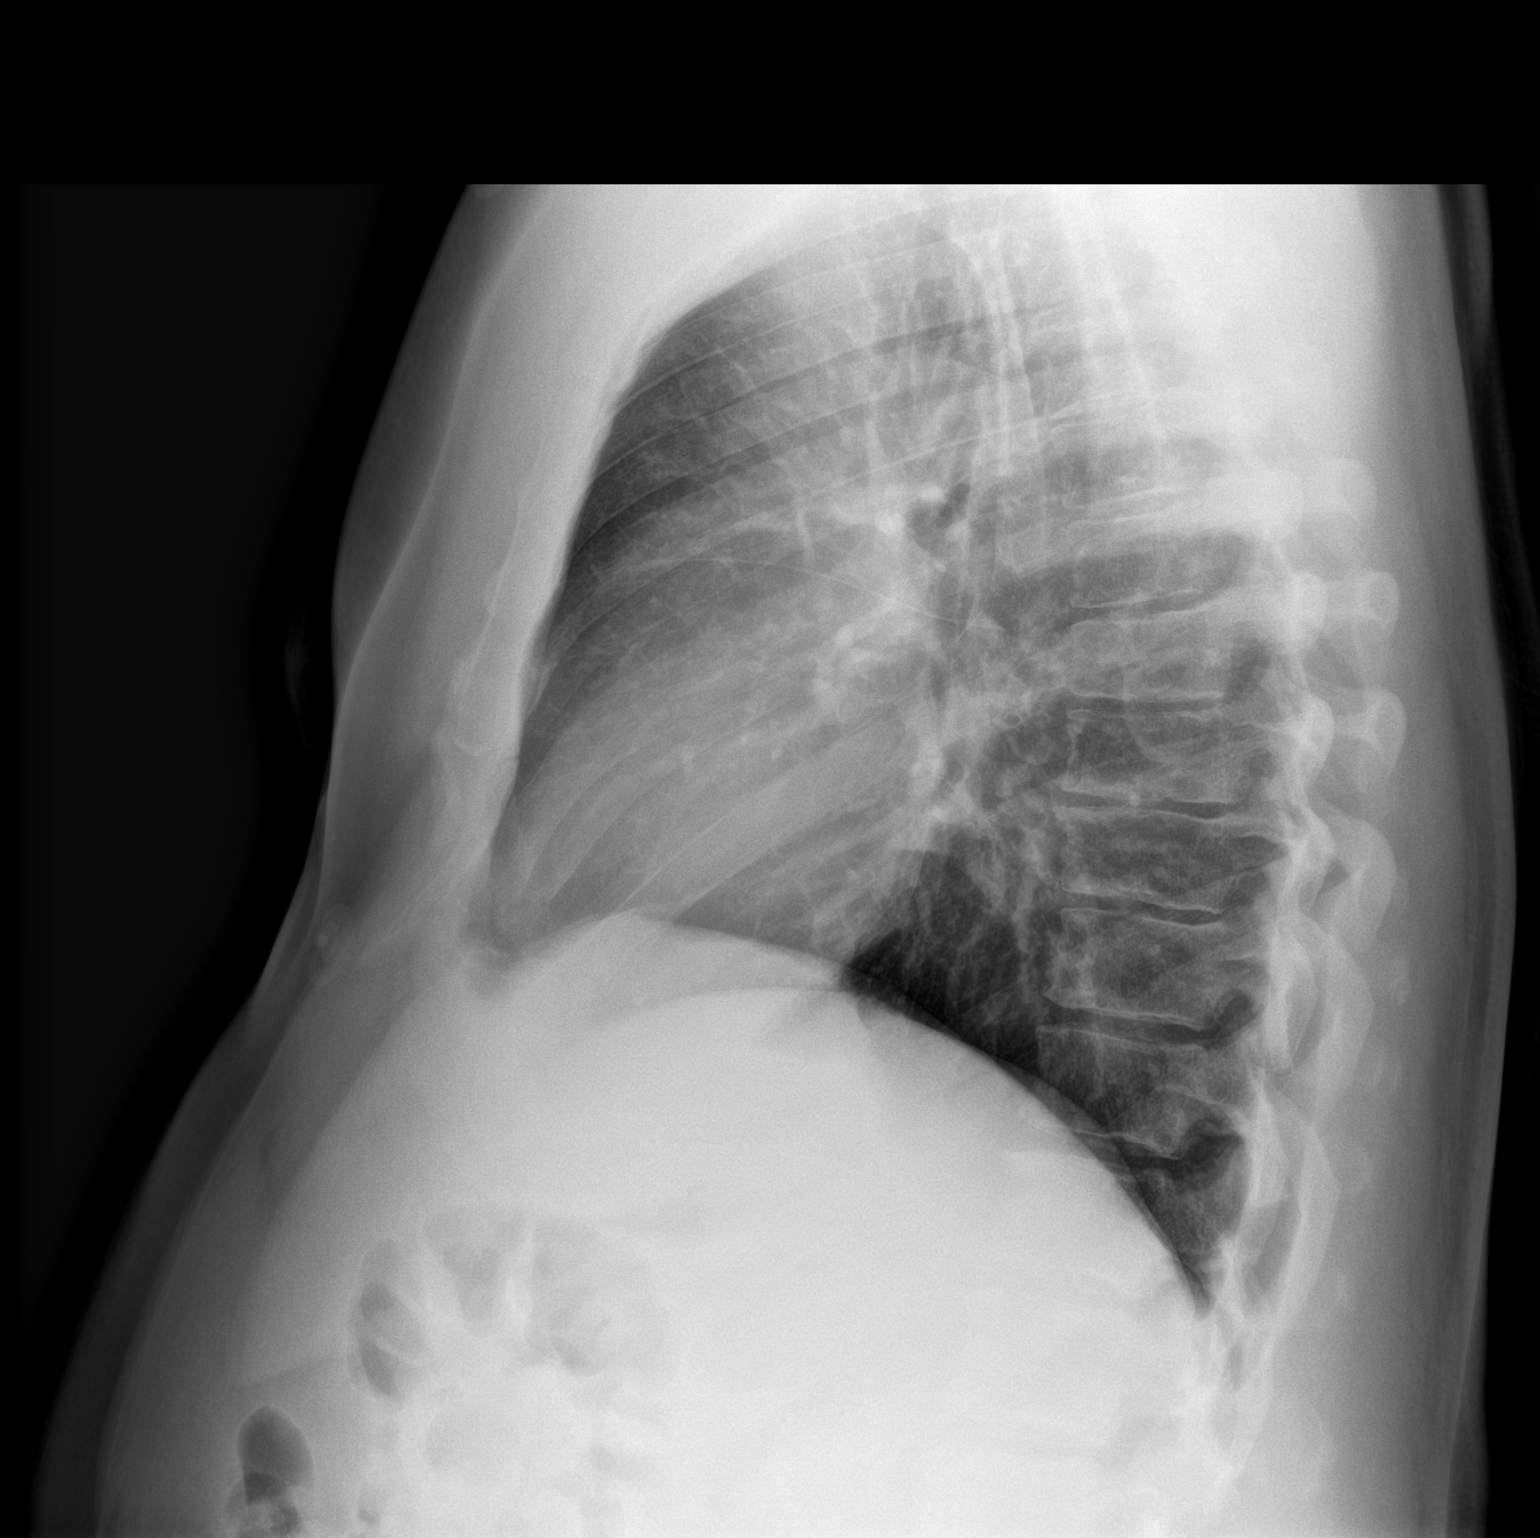

[2 of 2 positions shown; findings below may reference images not displayed]

FINDINGS: The heart size and mediastinal contours are within normal limits.
Both lungs are clear. The visualized skeletal structures are
unremarkable.
IMPRESSION: No active cardiopulmonary disease.

## 2023-11-30 ENCOUNTER — Encounter: Payer: Self-pay | Admitting: Orthopaedic Surgery

## 2023-12-07 ENCOUNTER — Ambulatory Visit: Payer: Self-pay | Admitting: Orthopaedic Surgery

## 2023-12-07 DIAGNOSIS — M1712 Unilateral primary osteoarthritis, left knee: Secondary | ICD-10-CM

## 2023-12-07 MED ORDER — BUPIVACAINE HCL 0.5 % IJ SOLN
2.0000 mL | INTRAMUSCULAR | Status: AC | PRN
  Administered 2023-12-07: 2 mL via INTRA_ARTICULAR

## 2023-12-07 MED ORDER — METHYLPREDNISOLONE ACETATE 40 MG/ML IJ SUSP
40.0000 mg | INTRAMUSCULAR | Status: AC | PRN
  Administered 2023-12-07: 40 mg via INTRA_ARTICULAR

## 2023-12-07 MED ORDER — LIDOCAINE HCL 1 % IJ SOLN
2.0000 mL | INTRAMUSCULAR | Status: AC | PRN
  Administered 2023-12-07: 2 mL

## 2023-12-07 NOTE — Progress Notes (Signed)
 Office Visit Note   Patient: Scott Horton           Date of Birth: September 27, 1970           MRN: [id number] Visit Date: 12/07/2023              Requested by: No referring provider defined for this encounter. PCP: Patient, No Pcp Per   Assessment & Plan: Visit Diagnoses:  1. Primary osteoarthritis of left knee     Plan: History of Present Illness Scott Horton is a 53 year old male with severe knee arthritis who presents with left knee pain and swelling.  He experiences significant swelling and severe pain in his left knee, which worsens with movement and sometimes causes him to wake up screaming. The knee occasionally feels like it locks, but there is no sensation of the kneecap slipping.  He has received four injections in the knee: two steroid injections and two gel injections. The gel injections provided relief for two to three days, but the effect of the cortisone injection is not remembered. Despite these treatments, symptoms persist.  He underwent knee surgery many years ago for a tendon issue and currently experiences pain in the same area.  He is not on blood thinners and lacks insurance, delaying surgical intervention. 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  Physical Exam MUSCULOSKELETAL: Left knee with flexion contracture of about 15 to 20 degrees.  Pain with flexion to 95 degrees.  Collaterals and cruciates are stable.  Assessment and Plan Left knee osteoarthritis with flexion contracture, pain, and swelling Chronic osteoarthritis with significant pain, swelling, and flexion contracture, causing functional limitations. Severe arthritis with bone-on-bone contact. Pain near previous tendon surgery site attributed to arthritis. Considering knee replacement but uninsured, delaying procedure. - Administer cortisone injection to left knee for temporary pain and swelling relief. -  Discussed importance of postoperative physical therapy for successful knee replacement outcomes. - Advised against scheduling knee replacement until postoperative physical therapy arrangements confirmed. - Educated on necessity of postoperative physical therapy exercises to prevent stiffness and ensure pain relief.  Follow-Up Instructions: No follow-ups on file.   Orders:  Orders Placed This Encounter  Procedures   Large Joint Inj: L knee   No orders of the defined types were placed in this encounter.     Procedures: Large Joint Inj: L knee on 12/07/2023 8:02 AM Details: 22 G needle Medications: 2 mL bupivacaine  0.5 %; 2 mL lidocaine  1 %; 40 mg methylPREDNISolone  acetate 40 MG/ML Outcome: tolerated well, no immediate complications Patient was prepped and draped in the usual sterile fashion.       Clinical Data: No additional findings.   Subjective: Chief Complaint  Patient presents with   Left Knee - Pain    HPI  Review of Systems  Constitutional: Negative.   HENT: Negative.    Eyes: Negative.   Respiratory: Negative.    Cardiovascular: Negative.   Gastrointestinal: Negative.   Endocrine: Negative.   Genitourinary: Negative.   Skin: Negative.   Allergic/Immunologic: Negative.   Neurological: Negative.   Hematological: Negative.   Psychiatric/Behavioral: Negative.    All other systems reviewed and are negative.    Objective: Vital Signs: There were no vitals taken for this visit.  Physical Exam Vitals and nursing note reviewed.  Constitutional:      Appearance: He is well-developed.  HENT:     Head: Normocephalic and atraumatic.  Eyes:     Pupils: Pupils are equal, round, and reactive to light.  Pulmonary:     Effort: Pulmonary effort is normal.  Abdominal:     Palpations: Abdomen is soft.  Musculoskeletal:        General: Normal range of motion.     Cervical back: Neck supple.  Skin:    General: Skin is warm.  Neurological:     Mental  Status: He is alert and oriented to person, place, and time.  Psychiatric:        Behavior: Behavior normal.        Thought Content: Thought content normal.        Judgment: Judgment normal.     Ortho Exam  Specialty Comments:  No specialty comments available.  Imaging: No results found.   PMFS History: Patient Active Problem List   Diagnosis Date Noted   Low back pain 05/06/2020   Status post lumbar spinal fusion 07/07/2012   Aftercare following surgery of the musculoskeletal system, NEC 07/07/2012   Disc degeneration, lumbar 06/17/2012   Degenerative lumbar spinal stenosis 06/17/2012   ANXIETY 01/12/2007   DEPRESSION 01/12/2007   GERD 01/12/2007   SHOULDER PAIN, RIGHT 01/12/2007   Past Medical History:  Diagnosis Date   Anxiety    history of  in the past, doesn't take anything now   Complication of anesthesia    his left eye was scratched by the tape during surgery   GERD (gastroesophageal reflux disease)     Family History  Problem Relation Age of Onset   CAD Other     Past Surgical History:  Procedure Laterality Date   BACK SURGERY  07/2011   KNEE SURGERY Left 2006   torn menicus   LUMBAR FUSION  07/2013   SACROILIAC JOINT FUSION Left 02/15/2014   Procedure: SACROILIAC JOINT FUSION;  Surgeon: Oneil Rodgers Priestly, MD;  Location: [facility] OR;  Service: Orthopedics;  Laterality: Left;  Left sided sacroiliac joint fusion   SHOULDER SURGERY Right 20 yrs. ago   Social History   Occupational History   Not on file  Tobacco Use   Smoking status: Former    Current packs/day: 0.00    Average packs/day: 1 pack/day for 20.0 years (20.0 ttl pk-yrs)    Types: Cigarettes    Start date: 01/02/1990    Quit date: 01/02/2010    Years since quitting: 13.9   Smokeless tobacco: Current    Types: Chew  Vaping Use   Vaping status: Every Day  Substance and Sexual Activity   Alcohol use: Yes    Comment: social   Drug use: No   Sexual activity: Not on file

## 2023-12-13 ENCOUNTER — Encounter: Payer: Self-pay | Admitting: Radiology
